# Patient Record
Sex: Female | Born: 1970 | Race: White | Hispanic: No | Marital: Married | Smoking: Former smoker
Health system: Southern US, Community
[De-identification: ages and names within clinical notes are randomized; demographics above are authoritative.]

## PROBLEM LIST (undated history)

## (undated) DIAGNOSIS — D219 Benign neoplasm of connective and other soft tissue, unspecified: Secondary | ICD-10-CM

## (undated) DIAGNOSIS — Z8742 Personal history of other diseases of the female genital tract: Secondary | ICD-10-CM

## (undated) HISTORY — PX: WISDOM TOOTH EXTRACTION: SHX21

## (undated) HISTORY — PX: TUBAL LIGATION: SHX77

## (undated) HISTORY — PX: DEEP SHOULDER TUMOR EXCISION: SUR577

## (undated) HISTORY — PX: APPENDECTOMY: SHX54

## (undated) HISTORY — DX: Benign neoplasm of connective and other soft tissue, unspecified: D21.9

## (undated) HISTORY — DX: Personal history of other diseases of the female genital tract: Z87.42

---

## 2009-05-12 ENCOUNTER — Ambulatory Visit (HOSPITAL_COMMUNITY): Admission: RE | Admit: 2009-05-12 | Discharge: 2009-05-12 | Payer: Self-pay | Admitting: Internal Medicine

## 2009-07-27 ENCOUNTER — Ambulatory Visit (HOSPITAL_COMMUNITY): Admission: RE | Admit: 2009-07-27 | Discharge: 2009-07-27 | Payer: Self-pay | Admitting: Internal Medicine

## 2013-10-28 ENCOUNTER — Ambulatory Visit: Payer: Self-pay

## 2015-12-04 ENCOUNTER — Encounter: Payer: Self-pay | Admitting: *Deleted

## 2015-12-07 ENCOUNTER — Ambulatory Visit (INDEPENDENT_AMBULATORY_CARE_PROVIDER_SITE_OTHER): Payer: Self-pay | Admitting: Obstetrics & Gynecology

## 2015-12-07 ENCOUNTER — Other Ambulatory Visit (HOSPITAL_COMMUNITY)
Admission: RE | Admit: 2015-12-07 | Discharge: 2015-12-07 | Disposition: A | Discharge status: Home / Self Care | Payer: Self-pay | Source: Ambulatory Visit | Attending: Obstetrics & Gynecology | Admitting: Obstetrics & Gynecology

## 2015-12-07 ENCOUNTER — Encounter: Payer: Self-pay | Admitting: Obstetrics & Gynecology

## 2015-12-07 VITALS — BP 110/70 | HR 72 | Ht 65.0 in | Wt 178.0 lb

## 2015-12-07 DIAGNOSIS — Z1151 Encounter for screening for human papillomavirus (HPV): Secondary | ICD-10-CM | POA: Insufficient documentation

## 2015-12-07 DIAGNOSIS — N813 Complete uterovaginal prolapse: Secondary | ICD-10-CM

## 2015-12-07 DIAGNOSIS — N8110 Cystocele, unspecified: Secondary | ICD-10-CM

## 2015-12-07 DIAGNOSIS — IMO0001 Reserved for inherently not codable concepts without codable children: Secondary | ICD-10-CM

## 2015-12-07 DIAGNOSIS — Z01419 Encounter for gynecological examination (general) (routine) without abnormal findings: Secondary | ICD-10-CM | POA: Insufficient documentation

## 2015-12-07 DIAGNOSIS — IMO0002 Reserved for concepts with insufficient information to code with codable children: Secondary | ICD-10-CM

## 2015-12-07 DIAGNOSIS — Z124 Encounter for screening for malignant neoplasm of cervix: Secondary | ICD-10-CM

## 2015-12-07 NOTE — Progress Notes (Signed)
Patient ID: Victoria Ramirez, female   DOB: 1971/04/08, 45 y.o.   MRN: [id number] Preoperative History and Physical  Victoria Ramirez is a 45 y.o. No obstetric history on file. with Patient's last menstrual period was 11/27/2015. admitted for a TVH anterior repair.  Grade 3 uterine prolapse Grade 2 cystocoele secondarily  PMH:    Past Medical History  Diagnosis Date  . History of PCOS   . Fibroid     PSH:     Past Surgical History  Procedure Laterality Date  . Tubal ligation    . Appendectomy    . Wisdom tooth extraction      POb/GynH:      OB History    No data available      SH:   Social History  Substance Use Topics  . Smoking status: Former Research scientist (life sciences)  . Smokeless tobacco: Never Used  . Alcohol Use: Yes     Comment: occ.    FH:    Family History  Problem Relation Age of Onset  . Asthma Mother   . Heart disease Father   . Polycystic ovary syndrome Sister      Allergies:  Allergies  Allergen Reactions  . Latex   . Penicillins Rash  . Sulfa Antibiotics Rash    Medications:      No current outpatient prescriptions on file.  Review of Systems:   Review of Systems  Constitutional: Negative for fever, chills, weight loss, malaise/fatigue and diaphoresis.  HENT: Negative for hearing loss, ear pain, nosebleeds, congestion, sore throat, neck pain, tinnitus and ear discharge.   Eyes: Negative for blurred vision, double vision, photophobia, pain, discharge and redness.  Respiratory: Negative for cough, hemoptysis, sputum production, shortness of breath, wheezing and stridor.   Cardiovascular: Negative for chest pain, palpitations, orthopnea, claudication, leg swelling and PND.  Gastrointestinal: Positive for abdominal pain. Negative for heartburn, nausea, vomiting, diarrhea, constipation, blood in stool and melena.  Genitourinary: Negative for dysuria, urgency, frequency, hematuria and flank pain.  Musculoskeletal: Negative for myalgias, back pain, joint pain and  falls.  Skin: Negative for itching and rash.  Neurological: Negative for dizziness, tingling, tremors, sensory change, speech change, focal weakness, seizures, loss of consciousness, weakness and headaches.  Endo/Heme/Allergies: Negative for environmental allergies and polydipsia. Does not bruise/bleed easily.  Psychiatric/Behavioral: Negative for depression, suicidal ideas, hallucinations, memory loss and substance abuse. The patient is not nervous/anxious and does not have insomnia.      PHYSICAL EXAM:  Blood pressure 110/70, pulse 72, height 5\' 5"  (1.651 m), weight 178 lb (80.74 kg), last menstrual period 11/27/2015.    Vitals reviewed. Constitutional: She is oriented to person, place, and time. She appears well-developed and well-nourished.  HENT:  Head: Normocephalic and atraumatic.  Right Ear: External ear normal.  Left Ear: External ear normal.  Nose: Nose normal.  Mouth/Throat: Oropharynx is clear and moist.  Eyes: Conjunctivae and EOM are normal. Pupils are equal, round, and reactive to light. Right eye exhibits no discharge. Left eye exhibits no discharge. No scleral icterus.  Neck: Normal range of motion. Neck supple. No tracheal deviation present. No thyromegaly present.  Cardiovascular: Normal rate, regular rhythm, normal heart sounds and intact distal pulses.  Exam reveals no gallop and no friction rub.   No murmur heard. Respiratory: Effort normal and breath sounds normal. No respiratory distress. She has no wheezes. She has no rales. She exhibits no tenderness.  GI: Soft. Bowel sounds are normal. She exhibits no distension and no mass. There  is tenderness. There is no rebound and no guarding.  Genitourinary:       Vulva is normal without lesions Vagina is pink moist without discharge cystocoele Cervix normal in appearance and pap is normal Uterus is normal size, contour, position, consistency, mobility, non-tender and prolapsed third degree Adnexa is negative with  normal sized ovaries by sonogram  Musculoskeletal: Normal range of motion. She exhibits no edema and no tenderness.  Neurological: She is alert and oriented to person, place, and time. She has normal reflexes. She displays normal reflexes. No cranial nerve deficit. She exhibits normal muscle tone. Coordination normal.  Skin: Skin is warm and dry. No rash noted. No erythema. No pallor.  Psychiatric: She has a normal mood and affect. Her behavior is normal. Judgment and thought content normal.    Labs: No results found for this or any previous visit (from the past 336 hour(s)).  EKG: No orders found for this or any previous visit.  Imaging Studies: No results found.    Assessment: 1. Third degree uterine prolapse   2. Cystocele, grade 2   Plan: Pt will require a TVH and anterior colporrhaphy, native tissue, vaginal vault suspension and pt elects to have a removal of both tubes and ovaries  Scheduled for 12/23/2015  Korianna Washer H 12/07/2015 2:32 PM

## 2015-12-09 LAB — CYTOLOGY - PAP

## 2015-12-16 NOTE — Patient Instructions (Signed)
Victoria Ramirez  12/16/2015     @PREFPERIOPPHARMACY @   Your procedure is scheduled on 12/23/2015.  Report to [facility] at 6:15 A.M.  Call this number if you have problems the morning of surgery:  [phone]   Remember:  Do not eat food or drink liquids after midnight.  Take these medicines the morning of surgery with A SIP OF WATER None   Do not wear jewelry, make-up or nail polish.  Do not wear lotions, powders, or perfumes.  You may wear deodorant.  Do not shave 48 hours prior to surgery.  Men may shave face and neck.  Do not bring valuables to the hospital.  [facility] is not responsible for any belongings or valuables.  Contacts, dentures or bridgework may not be worn into surgery.  Leave your suitcase in the car.  After surgery it may be brought to your room.  For patients admitted to the hospital, discharge time will be determined by your treatment team.  Patients discharged the day of surgery will not be allowed to drive home.   Please read over the following fact sheets that you were given. Surgical Site Infection Prevention and Anesthesia Post-op Instructions     PATIENT INSTRUCTIONS POST-ANESTHESIA  IMMEDIATELY FOLLOWING SURGERY:  Do not drive or operate machinery for the first twenty four hours after surgery.  Do not make any important decisions for twenty four hours after surgery or while taking narcotic pain medications or sedatives.  If you develop intractable nausea and vomiting or a severe headache please notify your doctor immediately.  FOLLOW-UP:  Please make an appointment with your surgeon as instructed. You do not need to follow up with anesthesia unless specifically instructed to do so.  WOUND CARE INSTRUCTIONS (if applicable):  Keep a dry clean dressing on the anesthesia/puncture wound site if there is drainage.  Once the wound has quit draining you may leave it open to air.  Generally you should leave the bandage intact for twenty four hours unless  there is drainage.  If the epidural site drains for more than 36-48 hours please call the anesthesia department.  QUESTIONS?:  Please feel free to call your physician or the hospital operator if you have any questions, and they will be happy to assist you.      Hysterectomy Information  A hysterectomy is a surgery in which your uterus is removed. This surgery may be done to treat various medical problems. After the surgery, you will no longer have menstrual periods. The surgery will also make you unable to become pregnant (sterile). The fallopian tubes and ovaries can be removed (bilateral salpingo-oophorectomy) during this surgery as well.  REASONS FOR A HYSTERECTOMY  Persistent, abnormal bleeding.  Lasting (chronic) pelvic pain or infection.  The lining of the uterus (endometrium) starts growing outside the uterus (endometriosis).  The endometrium starts growing in the muscle of the uterus (adenomyosis).  The uterus falls down into the vagina (pelvic organ prolapse).  Noncancerous growths in the uterus (uterine fibroids) that cause symptoms.  Precancerous cells.  Cervical cancer or uterine cancer. TYPES OF HYSTERECTOMIES  Supracervical hysterectomy--In this type, the top part of the uterus is removed, but not the cervix.  Total hysterectomy--The uterus and cervix are removed.  Radical hysterectomy--The uterus, the cervix, and the fibrous tissue that holds the uterus in place in the pelvis (parametrium) are removed. WAYS A HYSTERECTOMY CAN BE PERFORMED  Abdominal hysterectomy--A large surgical cut (incision) is made in the abdomen. The uterus is  removed through this incision.  Vaginal hysterectomy--An incision is made in the vagina. The uterus is removed through this incision. There are no abdominal incisions.  Conventional laparoscopic hysterectomy--Three or four small incisions are made in the abdomen. A thin, lighted tube with a camera (laparoscope) is inserted into one of  the incisions. Other tools are put through the other incisions. The uterus is cut into small pieces. The small pieces are removed through the incisions, or they are removed through the vagina.  Laparoscopically assisted vaginal hysterectomy (LAVH)--Three or four small incisions are made in the abdomen. Part of the surgery is performed laparoscopically and part vaginally. The uterus is removed through the vagina.  Robot-assisted laparoscopic hysterectomy--A laparoscope and other tools are inserted into 3 or 4 small incisions in the abdomen. A computer-controlled device is used to give the surgeon a 3D image and to help control the surgical instruments. This allows for more precise movements of surgical instruments. The uterus is cut into small pieces and removed through the incisions or removed through the vagina. RISKS AND COMPLICATIONS  Possible complications associated with this procedure include:  Bleeding and risk of blood transfusion. Tell your health care provider if you do not want to receive any blood products.  Blood clots in the legs or lung.  Infection.  Injury to surrounding organs.  Problems or side effects related to anesthesia.  Conversion to an abdominal hysterectomy from one of the other techniques. WHAT TO EXPECT AFTER A HYSTERECTOMY  You will be given pain medicine.  You will need to have someone with you for the first 3-5 days after you go home.  You will need to follow up with your surgeon in 2-4 weeks after surgery to evaluate your progress.  You may have early menopause symptoms such as hot flashes, night sweats, and insomnia.  If you had a hysterectomy for a problem that was not cancer or not a condition that could lead to cancer, then you no longer need Pap tests. However, even if you no longer need a Pap test, a regular exam is a good idea to make sure no other problems are starting.   This information is not intended to replace advice given to you by your  health care provider. Make sure you discuss any questions you have with your health care provider.   Document Released: 12/28/2000 Document Revised: 04/24/2013 Document Reviewed: 03/11/2013 Elsevier Interactive Patient Education [employer].

## 2015-12-18 ENCOUNTER — Other Ambulatory Visit: Payer: Self-pay | Admitting: Obstetrics & Gynecology

## 2015-12-18 ENCOUNTER — Encounter (HOSPITAL_COMMUNITY): Payer: Self-pay

## 2015-12-18 ENCOUNTER — Encounter (HOSPITAL_COMMUNITY)
Admission: RE | Admit: 2015-12-18 | Discharge: 2015-12-18 | Disposition: A | Discharge status: Home / Self Care | Payer: Self-pay | Source: Ambulatory Visit | Attending: Obstetrics & Gynecology | Admitting: Obstetrics & Gynecology

## 2015-12-18 DIAGNOSIS — Z01812 Encounter for preprocedural laboratory examination: Secondary | ICD-10-CM | POA: Insufficient documentation

## 2015-12-18 DIAGNOSIS — N813 Complete uterovaginal prolapse: Secondary | ICD-10-CM | POA: Insufficient documentation

## 2015-12-18 DIAGNOSIS — N8110 Cystocele, unspecified: Secondary | ICD-10-CM | POA: Insufficient documentation

## 2015-12-18 LAB — CBC
HCT: 36.4 % (ref 36.0–46.0)
Hemoglobin: 12.6 g/dL (ref 12.0–15.0)
MCH: 31.1 pg (ref 26.0–34.0)
MCHC: 34.6 g/dL (ref 30.0–36.0)
MCV: 89.9 fL (ref 78.0–100.0)
PLATELETS: 262 10*3/uL (ref 150–400)
RBC: 4.05 MIL/uL (ref 3.87–5.11)
RDW: 12.4 % (ref 11.5–15.5)
WBC: 6.2 10*3/uL (ref 4.0–10.5)

## 2015-12-18 LAB — URINALYSIS, ROUTINE W REFLEX MICROSCOPIC
Bilirubin Urine: NEGATIVE
Glucose, UA: NEGATIVE mg/dL
Ketones, ur: NEGATIVE mg/dL
LEUKOCYTES UA: NEGATIVE
NITRITE: NEGATIVE
PROTEIN: NEGATIVE mg/dL
SPECIFIC GRAVITY, URINE: 1.01 (ref 1.005–1.030)
pH: 6.5 (ref 5.0–8.0)

## 2015-12-18 LAB — COMPREHENSIVE METABOLIC PANEL
ALT: 17 U/L (ref 14–54)
AST: 17 U/L (ref 15–41)
Albumin: 4.3 g/dL (ref 3.5–5.0)
Alkaline Phosphatase: 82 U/L (ref 38–126)
Anion gap: 7 (ref 5–15)
BUN: 12 mg/dL (ref 6–20)
CHLORIDE: 108 mmol/L (ref 101–111)
CO2: 25 mmol/L (ref 22–32)
CREATININE: 0.61 mg/dL (ref 0.44–1.00)
Calcium: 9.2 mg/dL (ref 8.9–10.3)
GFR calc non Af Amer: >60 mL/min (ref >60)
Glucose, Bld: 116 mg/dL — ABNORMAL HIGH (ref 65–99)
POTASSIUM: 3.8 mmol/L (ref 3.5–5.1)
SODIUM: 140 mmol/L (ref 135–145)
Total Bilirubin: 0.7 mg/dL (ref 0.3–1.2)
Total Protein: 6.9 g/dL (ref 6.5–8.1)

## 2015-12-18 LAB — URINE MICROSCOPIC-ADD ON: WBC UA: NONE SEEN WBC/hpf (ref 0–5)

## 2015-12-18 LAB — HCG, QUANTITATIVE, PREGNANCY

## 2015-12-19 LAB — TYPE AND SCREEN
ABO/RH(D): B POS
ANTIBODY SCREEN: NEGATIVE

## 2015-12-23 ENCOUNTER — Ambulatory Visit (HOSPITAL_COMMUNITY): Payer: Self-pay | Admitting: Anesthesiology

## 2015-12-23 ENCOUNTER — Encounter (HOSPITAL_COMMUNITY): Payer: Self-pay | Admitting: *Deleted

## 2015-12-23 ENCOUNTER — Encounter (HOSPITAL_COMMUNITY)
Admission: RE | Disposition: A | Discharge status: Home / Self Care | Payer: Self-pay | Source: Ambulatory Visit | Attending: Obstetrics & Gynecology

## 2015-12-23 ENCOUNTER — Observation Stay (HOSPITAL_COMMUNITY)
Admission: RE | Admit: 2015-12-23 | Discharge: 2015-12-24 | Disposition: A | Discharge status: Home / Self Care | Payer: Self-pay | Source: Ambulatory Visit | Attending: Obstetrics & Gynecology | Admitting: Obstetrics & Gynecology

## 2015-12-23 DIAGNOSIS — N888 Other specified noninflammatory disorders of cervix uteri: Secondary | ICD-10-CM

## 2015-12-23 DIAGNOSIS — N8111 Cystocele, midline: Secondary | ICD-10-CM

## 2015-12-23 DIAGNOSIS — N813 Complete uterovaginal prolapse: Principal | ICD-10-CM | POA: Insufficient documentation

## 2015-12-23 DIAGNOSIS — Z90710 Acquired absence of both cervix and uterus: Secondary | ICD-10-CM | POA: Diagnosis present

## 2015-12-23 DIAGNOSIS — N83209 Unspecified ovarian cyst, unspecified side: Secondary | ICD-10-CM

## 2015-12-23 DIAGNOSIS — Z87891 Personal history of nicotine dependence: Secondary | ICD-10-CM | POA: Insufficient documentation

## 2015-12-23 HISTORY — PX: VAGINAL HYSTERECTOMY: SHX2639

## 2015-12-23 SURGERY — HYSTERECTOMY, VAGINAL
Anesthesia: General

## 2015-12-23 MED ORDER — ROCURONIUM BROMIDE 100 MG/10ML IV SOLN
INTRAVENOUS | Status: DC | PRN
  Administered 2015-12-23: 40 mg via INTRAVENOUS
  Administered 2015-12-23: 20 mg via INTRAVENOUS

## 2015-12-23 MED ORDER — GLYCOPYRROLATE 0.2 MG/ML IJ SOLN
INTRAMUSCULAR | Status: DC | PRN
  Administered 2015-12-23: .7 mg via INTRAVENOUS

## 2015-12-23 MED ORDER — BISACODYL 10 MG RE SUPP
10.0000 mg | Freq: Every day | RECTAL | Status: DC | PRN
Start: 2015-12-23 — End: 2015-12-24

## 2015-12-23 MED ORDER — FENTANYL CITRATE (PF) 250 MCG/5ML IJ SOLN
INTRAMUSCULAR | Status: DC | PRN
  Administered 2015-12-23 (×10): 50 ug via INTRAVENOUS

## 2015-12-23 MED ORDER — FENTANYL CITRATE (PF) 250 MCG/5ML IJ SOLN
INTRAMUSCULAR | Status: AC
  Filled 2015-12-23: qty 5

## 2015-12-23 MED ORDER — MIDAZOLAM HCL 2 MG/2ML IJ SOLN
INTRAMUSCULAR | Status: AC
  Filled 2015-12-23: qty 2

## 2015-12-23 MED ORDER — LACTATED RINGERS IV SOLN
INTRAVENOUS | Status: DC
  Administered 2015-12-23: 1000 mL via INTRAVENOUS
  Administered 2015-12-23: 09:00:00 via INTRAVENOUS

## 2015-12-23 MED ORDER — KETOROLAC TROMETHAMINE 30 MG/ML IJ SOLN
30.0000 mg | Freq: Once | INTRAMUSCULAR | Status: AC
  Administered 2015-12-23: 30 mg via INTRAVENOUS
  Filled 2015-12-23: qty 1

## 2015-12-23 MED ORDER — SODIUM CHLORIDE 0.9 % IV SOLN: 8.0000 mg | Freq: Four times a day (QID) | INTRAVENOUS | Status: DC | PRN

## 2015-12-23 MED ORDER — ONDANSETRON HCL 4 MG PO TABS: 8.0000 mg | ORAL_TABLET | Freq: Four times a day (QID) | ORAL | Status: DC | PRN

## 2015-12-23 MED ORDER — NEOSTIGMINE METHYLSULFATE 10 MG/10ML IV SOLN
INTRAVENOUS | Status: DC | PRN
  Administered 2015-12-23: 4 mg via INTRAVENOUS

## 2015-12-23 MED ORDER — CIPROFLOXACIN IN D5W 400 MG/200ML IV SOLN
INTRAVENOUS | Status: AC
  Filled 2015-12-23: qty 200

## 2015-12-23 MED ORDER — ROCURONIUM BROMIDE 50 MG/5ML IV SOLN
INTRAVENOUS | Status: AC
  Filled 2015-12-23: qty 1

## 2015-12-23 MED ORDER — KCL IN DEXTROSE-NACL 20-5-0.45 MEQ/L-%-% IV SOLN
INTRAVENOUS | Status: DC
  Administered 2015-12-23 – 2015-12-24 (×3): via INTRAVENOUS

## 2015-12-23 MED ORDER — LIDOCAINE HCL (PF) 1 % IJ SOLN
INTRAMUSCULAR | Status: AC
  Filled 2015-12-23: qty 5

## 2015-12-23 MED ORDER — CLINDAMYCIN PHOSPHATE 900 MG/50ML IV SOLN
INTRAVENOUS | Status: AC
  Filled 2015-12-23: qty 50

## 2015-12-23 MED ORDER — LIDOCAINE HCL 1 % IJ SOLN
INTRAMUSCULAR | Status: DC | PRN
  Administered 2015-12-23: 40 mg via INTRADERMAL

## 2015-12-23 MED ORDER — BUPIVACAINE-EPINEPHRINE (PF) 0.5% -1:200000 IJ SOLN
INTRAMUSCULAR | Status: DC | PRN
  Administered 2015-12-23: 18 mL

## 2015-12-23 MED ORDER — BUPIVACAINE-EPINEPHRINE (PF) 0.5% -1:200000 IJ SOLN
INTRAMUSCULAR | Status: AC
  Filled 2015-12-23: qty 30

## 2015-12-23 MED ORDER — OXYCODONE-ACETAMINOPHEN 5-325 MG PO TABS: 1.0000 | ORAL_TABLET | ORAL | Status: DC | PRN

## 2015-12-23 MED ORDER — CIPROFLOXACIN HCL 250 MG PO TABS
500.0000 mg | ORAL_TABLET | Freq: Two times a day (BID) | ORAL | Status: DC
  Administered 2015-12-23 – 2015-12-24 (×2): 500 mg via ORAL
  Filled 2015-12-23 (×2): qty 2

## 2015-12-23 MED ORDER — CLINDAMYCIN PHOSPHATE 900 MG/50ML IV SOLN
900.0000 mg | INTRAVENOUS | Status: AC
  Administered 2015-12-23: 900 mg via INTRAVENOUS

## 2015-12-23 MED ORDER — CIPROFLOXACIN IN D5W 400 MG/200ML IV SOLN
400.0000 mg | INTRAVENOUS | Status: AC
  Administered 2015-12-23: 400 mg via INTRAVENOUS

## 2015-12-23 MED ORDER — ALUM & MAG HYDROXIDE-SIMETH 200-200-20 MG/5ML PO SUSP: 30.0000 mL | ORAL | Status: DC | PRN

## 2015-12-23 MED ORDER — SODIUM CHLORIDE 0.9 % IR SOLN
Status: DC | PRN
  Administered 2015-12-23: 3000 mL

## 2015-12-23 MED ORDER — MIDAZOLAM HCL 2 MG/2ML IJ SOLN
1.0000 mg | INTRAMUSCULAR | Status: DC | PRN
  Administered 2015-12-23 (×2): 2 mg via INTRAVENOUS
  Filled 2015-12-23: qty 2

## 2015-12-23 MED ORDER — HYDROMORPHONE HCL 2 MG PO TABS
2.0000 mg | ORAL_TABLET | ORAL | Status: DC | PRN
  Administered 2015-12-23: 4 mg via ORAL
  Administered 2015-12-24: 2 mg via ORAL
  Administered 2015-12-24: 4 mg via ORAL
  Filled 2015-12-23: qty 2
  Filled 2015-12-23 (×3): qty 1
  Filled 2015-12-23: qty 2

## 2015-12-23 MED ORDER — HYDROMORPHONE HCL 1 MG/ML IJ SOLN
1.0000 mg | INTRAMUSCULAR | Status: DC | PRN
  Administered 2015-12-23 (×3): 1 mg via INTRAVENOUS
  Filled 2015-12-23 (×3): qty 1

## 2015-12-23 MED ORDER — 0.9 % SODIUM CHLORIDE (POUR BTL) OPTIME
TOPICAL | Status: DC | PRN
  Administered 2015-12-23: 1000 mL

## 2015-12-23 MED ORDER — ONDANSETRON HCL 4 MG/2ML IJ SOLN
4.0000 mg | Freq: Once | INTRAMUSCULAR | Status: AC | PRN
  Administered 2015-12-23: 4 mg via INTRAVENOUS
  Filled 2015-12-23: qty 2

## 2015-12-23 MED ORDER — PROPOFOL 10 MG/ML IV BOLUS
INTRAVENOUS | Status: AC
  Filled 2015-12-23: qty 20

## 2015-12-23 MED ORDER — FENTANYL CITRATE (PF) 100 MCG/2ML IJ SOLN
25.0000 ug | INTRAMUSCULAR | Status: DC | PRN
  Administered 2015-12-23 (×4): 50 ug via INTRAVENOUS
  Filled 2015-12-23 (×2): qty 2

## 2015-12-23 MED ORDER — PROPOFOL 10 MG/ML IV BOLUS
INTRAVENOUS | Status: DC | PRN
  Administered 2015-12-23: 150 mg via INTRAVENOUS

## 2015-12-23 MED ORDER — ZOLPIDEM TARTRATE 5 MG PO TABS: 5.0000 mg | ORAL_TABLET | Freq: Every evening | ORAL | Status: DC | PRN

## 2015-12-23 MED ORDER — ONDANSETRON HCL 4 MG/2ML IJ SOLN
4.0000 mg | Freq: Once | INTRAMUSCULAR | Status: AC
  Administered 2015-12-23: 4 mg via INTRAVENOUS

## 2015-12-23 MED ORDER — ONDANSETRON HCL 4 MG/2ML IJ SOLN
INTRAMUSCULAR | Status: AC
  Filled 2015-12-23: qty 2

## 2015-12-23 SURGICAL SUPPLY — 49 items
APPLIER CLIP 13 LRG OPEN (CLIP) ×3
BAG HAMPER (MISCELLANEOUS) ×3 IMPLANT
CATH FOLEY LATEX FREE 14FR (CATHETERS) ×2
CATH FOLEY LF 14FR (CATHETERS) ×1 IMPLANT
CLIP APPLIE 13 LRG OPEN (CLIP) ×1 IMPLANT
CLOTH BEACON ORANGE TIMEOUT ST (SAFETY) ×3 IMPLANT
COVER LIGHT HANDLE STERIS (MISCELLANEOUS) ×6 IMPLANT
DECANTER SPIKE VIAL GLASS SM (MISCELLANEOUS) ×3 IMPLANT
DEVICE CAPIO SUTURING (INSTRUMENTS)
DEVICE CAPIO SUTURING OPC (INSTRUMENTS) IMPLANT
DRAPE PROXIMA HALF (DRAPES) ×3 IMPLANT
DRAPE STERI URO 9X17 APER PCH (DRAPES) ×3 IMPLANT
ELECT REM PT RETURN 9FT ADLT (ELECTROSURGICAL) ×3
ELECTRODE REM PT RTRN 9FT ADLT (ELECTROSURGICAL) ×1 IMPLANT
FORMALIN 10 PREFIL 480ML (MISCELLANEOUS) ×3 IMPLANT
GAUZE PACKING 2X5 YD STRL (GAUZE/BANDAGES/DRESSINGS) IMPLANT
GLOVE BIOGEL PI IND STRL 7.0 (GLOVE) ×3 IMPLANT
GLOVE BIOGEL PI IND STRL 7.5 (GLOVE) ×1 IMPLANT
GLOVE BIOGEL PI IND STRL 8 (GLOVE) ×1 IMPLANT
GLOVE BIOGEL PI INDICATOR 7.0 (GLOVE) ×6
GLOVE BIOGEL PI INDICATOR 7.5 (GLOVE) ×2
GLOVE BIOGEL PI INDICATOR 8 (GLOVE) ×2
GLOVE ECLIPSE 8.0 STRL XLNG CF (GLOVE) IMPLANT
GOWN STRL REUS W/TWL LRG LVL3 (GOWN DISPOSABLE) ×6 IMPLANT
GOWN STRL REUS W/TWL XL LVL3 (GOWN DISPOSABLE) ×3 IMPLANT
IV NS IRRIG 3000ML ARTHROMATIC (IV SOLUTION) ×3 IMPLANT
KIT BLADEGUARD II DBL (SET/KITS/TRAYS/PACK) ×3 IMPLANT
KIT ROOM TURNOVER AP CYSTO (KITS) ×3 IMPLANT
KIT ROOM TURNOVER APOR (KITS) ×3 IMPLANT
MANIFOLD NEPTUNE II (INSTRUMENTS) ×3 IMPLANT
NEEDLE HYPO 22GX1.5 SAFETY (NEEDLE) ×3 IMPLANT
NS IRRIG 1000ML POUR BTL (IV SOLUTION) ×3 IMPLANT
PACK PERI GYN (CUSTOM PROCEDURE TRAY) ×3 IMPLANT
PAD ARMBOARD 7.5X6 YLW CONV (MISCELLANEOUS) ×3 IMPLANT
SET BASIN LINEN APH (SET/KITS/TRAYS/PACK) ×3 IMPLANT
SET IV ADMIN VERSALIGHT (MISCELLANEOUS) ×3 IMPLANT
SUT CAPIO POLYGLYCOLIC (SUTURE) IMPLANT
SUT MNCRL+ AB 3-0 CT1 36 (SUTURE) ×1 IMPLANT
SUT MON AB 3-0 SH 27 (SUTURE) IMPLANT
SUT MONOCRYL AB 3-0 CT1 36IN (SUTURE) ×2
SUT VIC AB 0 CT1 27 (SUTURE) ×8
SUT VIC AB 0 CT1 27XBRD ANTBC (SUTURE) IMPLANT
SUT VIC AB 0 CT1 27XCR 8 STRN (SUTURE) ×4 IMPLANT
SUT VIC AB 0 CT2 8-18 (SUTURE) ×3 IMPLANT
SYR CONTROL 10ML LL (SYRINGE) ×3 IMPLANT
SYRINGE 10CC LL (SYRINGE) ×3 IMPLANT
TOWEL OR 17X26 4PK STRL BLUE (TOWEL DISPOSABLE) ×3 IMPLANT
TRAY FOLEY CATH SILVER 16FR (SET/KITS/TRAYS/PACK) IMPLANT
VERSALIGHT (MISCELLANEOUS) ×3 IMPLANT

## 2015-12-23 NOTE — Anesthesia Postprocedure Evaluation (Signed)
Anesthesia Post Note  Patient: WAJEEHA ALSBURY  Procedure(s) Performed: Procedure(s) (LRB): HYSTERECTOMY VAGINAL, REMOVAL OF BOTH TUBES AND OVARIES, VAGINAL APEX SUSPENSION (N/A)  Patient location during evaluation: PACU Anesthesia Type: General Level of consciousness: awake and alert Pain management: pain level controlled Vital Signs Assessment: post-procedure vital signs reviewed and stable Respiratory status: spontaneous breathing Cardiovascular status: blood pressure returned to baseline Postop Assessment: no signs of nausea or vomiting Anesthetic complications: no    Last Vitals:  Filed Vitals:   12/23/15 0730 12/23/15 0735  BP: 101/67 100/62  Pulse:    Temp:    Resp: 20 25    Last Pain:  Filed Vitals:   12/23/15 0736  PainSc: 3                  Marli Diego

## 2015-12-23 NOTE — Anesthesia Preprocedure Evaluation (Signed)
Anesthesia Evaluation  Patient identified by MRN, date of birth, ID band Patient awake    Reviewed: Allergy & Precautions, NPO status , Patient's Chart, lab work & pertinent test results  Airway Mallampati: I  TM Distance: >3 FB     Dental  (+) Teeth Intact, Dental Advisory Given   Pulmonary former smoker,    breath sounds clear to auscultation       Cardiovascular negative cardio ROS   Rhythm:Regular Rate:Normal     Neuro/Psych    GI/Hepatic negative GI ROS,   Endo/Other    Renal/GU      Musculoskeletal   Abdominal   Peds  Hematology   Anesthesia Other Findings   Reproductive/Obstetrics                             Anesthesia Physical Anesthesia Plan  ASA: I  Anesthesia Plan: General   Post-op Pain Management:    Induction: Intravenous  Airway Management Planned: Oral ETT  Additional Equipment:   Intra-op Plan:   Post-operative Plan: Extubation in OR  Informed Consent: I have reviewed the patients History and Physical, chart, labs and discussed the procedure including the risks, benefits and alternatives for the proposed anesthesia with the patient or authorized representative who has indicated his/her understanding and acceptance.     Plan Discussed with:   Anesthesia Plan Comments:         Anesthesia Quick Evaluation

## 2015-12-23 NOTE — Interval H&P Note (Signed)
History and Physical Interval Note:  12/23/2015 7:36 AM  Victoria Ramirez  has presented today for surgery, with the diagnosis of 3rd degree uterine prolapse cystocele  The various methods of treatment have been discussed with the patient and family. After consideration of risks, benefits and other options for treatment, the patient has consented to  Procedure(s): HYSTERECTOMY VAGINAL, REMOVAL OF BOTH TUBES AND OVARIES (N/A) ANTERIOR REPAIR (CYSTOCELE) (N/A) as a surgical intervention .  The patient's history has been reviewed, patient examined, no change in status, stable for surgery.  I have reviewed the patient's chart and labs.  Questions were answered to the patient's satisfaction.     EURE,LUTHER H

## 2015-12-23 NOTE — Op Note (Signed)
Preoperative diagnosis:  1.  Grade III uterine prolapse                                         2.  Grade II bladder prolapse                                         3.                                           4.    Postoperative diagnosis:  Same as above + after TVH and vaginal apex fixation there was minimal bladder relaxation,   Procedure:  Vaginal hysterectomy with removal of both tubes and ovaries                      In situ vaginal apex suspension  Surgeon:  Florian Buff MD  Anesthesia:  General Endotracheal  Findings:  Grade III uterine prolapse, uterus normal, both tubes and ovaries were normal After hysterectomy and apex suspension of the vagina the bladder support was adequate and anterior colporrhaphy was not appropriate at this time  Description of operation:  The patient was taken from the preoperative area to the operating room in stable condition. She was placed in the supine position and underwent a  General endotracheal anesthetic. She was then placed in the dorsal lithotomy position. Patient was prepped and draped in the usual sterile fashion and a Foley catheter was placed.  A weighted speculum was placed and the cervix was grasped with thyroid tenaculums both anteriorly and posteriorly.  0.5% Marcaine plain was injected in a circumferential fashion about the cervix and the electrocautery unit was used to incise the vagina and push at all cervix.  The posterior cul-de-sac was then entered sharply without difficulty.  The uterosacral ligaments were clamped cut and inspection suture ligated and held.  The cardinal ligaments were then clamped cut transfixion suture ligated and cut. The anterior peritoneum was identified the anterior cul-de-sac was entered sharply without difficulty. The anterior and posterior leaves of the broad ligament were plicated and the uterine vessels were clamped cut and suture ligated. Serial pedicles were taken of the fundus with each pedicle being  clamped cut and suture ligated. The utero-ovarian ligaments were crossclamped the uterus was removed and both pedicles were transfixion suture ligated. The infundibulo pelvic ligaments were then cross clamped, and double suture ligated, fore and aaft suture techniques. There was good hemostasis of all the pedicles. The peritoneum was then closed in a pursestring fashion using 3-0 monocryl.  The uterosacral ligaments were plicated with several sutures to provide in situ vaginal apex support.The anterior posterior vagina was closed in interrupted fashion with good resultant hemostasis. Our closure the lower pelvis and vagina were irrigated vigorously.Once this was all done there was really no significant cystocoele, certainly not requiring an anterior repair.  My best clinical guess is that at some point in the future 0-20 years she may require additional support surgery but would not want to intervene at this point.   The sponge needle and instrument counts were correct x 3.  Total blood loss for the procedure was 100 cc.  The patient received Cipro and cleocin and 30 mg of Toradol IV preoperatively prophylactically.  She was taken to the recovery room in good stable condition awake alert doing well.  All specimens were sent to pathology  Demorris Choyce H 12/23/2015, 9:55 AM

## 2015-12-23 NOTE — Anesthesia Procedure Notes (Addendum)
Procedure Name: Intubation Date/Time: 12/23/2015 7:50 AM Performed by: Tressie Stalker E Pre-anesthesia Checklist: Patient identified, Patient being monitored, Timeout performed, Emergency Drugs available and Suction available Patient Re-evaluated:Patient Re-evaluated prior to inductionOxygen Delivery Method: Circle system utilized Preoxygenation: Pre-oxygenation with 100% oxygen Intubation Type: IV induction Ventilation: Mask ventilation without difficulty Laryngoscope Size: Mac and 3 Grade View: Grade I Tube type: Oral Tube size: 7.0 mm Number of attempts: 1 Airway Equipment and Method: Stylet Placement Confirmation: ETT inserted through vocal cords under direct vision,  positive ETCO2 and breath sounds checked- equal and bilateral Secured at: 21 cm Tube secured with: Tape Dental Injury: Teeth and Oropharynx as per pre-operative assessment

## 2015-12-23 NOTE — Transfer of Care (Signed)
Immediate Anesthesia Transfer of Care Note  Patient: Victoria Ramirez  Procedure(s) Performed: Procedure(s): HYSTERECTOMY VAGINAL, REMOVAL OF BOTH TUBES AND OVARIES, VAGINAL APEX SUSPENSION (N/A)  Patient Location: PACU  Anesthesia Type:General  Level of Consciousness: awake, alert  and oriented  Airway & Oxygen Therapy: Patient Spontanous Breathing and Patient connected to nasal cannula oxygen  Post-op Assessment: Report given to RN  Post vital signs: Reviewed and stable  Last Vitals:  Filed Vitals:   12/23/15 0730 12/23/15 0735  BP: 101/67 100/62  Pulse:    Temp:    Resp: 20 25    Last Pain:  Filed Vitals:   12/23/15 0736  PainSc: 3       Patients Stated Pain Goal: 9 ([id number])  Complications: No apparent anesthesia complications

## 2015-12-23 NOTE — H&P (Signed)
Preoperative History and Physical  Victoria Ramirez is a 45 y.o. No obstetric history on file. with Patient's last menstrual period was 11/27/2015. admitted for a TVHBSO anterior colporrhaphy for symptomatic Grade 3 uterine and grade 2 bladder prolapse.  Pt elects removal of both tubes and ovaries  PMH:  Past Medical History  Diagnosis Date  . History of PCOS   . Fibroid     PSH:  Past Surgical History  Procedure Laterality Date  . Tubal ligation    . Appendectomy    . Wisdom tooth extraction      POb/GynH:  OB History    No data available      SH:  Social History  Substance Use Topics  . Smoking status: Former Research scientist (life sciences)  . Smokeless tobacco: Never Used  . Alcohol Use: Yes     Comment: occ.    FH:  Family History  Problem Relation Age of Onset  . Asthma Mother   . Heart disease Father   . Polycystic ovary syndrome Sister      Allergies:  Allergies  Allergen Reactions  . Latex   . Penicillins Rash  . Sulfa Antibiotics Rash    Medications: No current outpatient prescriptions on file.  Review of Systems:   Review of Systems  Constitutional: Negative for fever, chills, weight loss, malaise/fatigue and diaphoresis.  HENT: Negative for hearing loss, ear pain, nosebleeds, congestion, sore throat, neck pain, tinnitus and ear discharge.  Eyes: Negative for blurred vision, double vision, photophobia, pain, discharge and redness.  Respiratory: Negative for cough, hemoptysis, sputum production, shortness of breath, wheezing and stridor.  Cardiovascular: Negative for chest pain, palpitations, orthopnea, claudication, leg swelling and PND.  Gastrointestinal: Negative for abdominal pain. Negative for heartburn, nausea, vomiting, diarrhea, constipation, blood in stool and melena.  Genitourinary: Negative for dysuria, urgency, frequency, hematuria and flank pain.   Musculoskeletal: Negative for myalgias, back pain, joint pain and falls.  Skin: Negative for itching and rash.  Neurological: Negative for dizziness, tingling, tremors, sensory change, speech change, focal weakness, seizures, loss of consciousness, weakness and headaches.  Endo/Heme/Allergies: Negative for environmental allergies and polydipsia. Does not bruise/bleed easily.  Psychiatric/Behavioral: Negative for depression, suicidal ideas, hallucinations, memory loss and substance abuse. The patient is not nervous/anxious and does not have insomnia.     PHYSICAL EXAM:  Blood pressure 110/70, pulse 72, height '5\' 5"'  (1.651 m), weight 178 lb (80.74 kg), last menstrual period 11/27/2015.   Vitals reviewed. Constitutional: She is oriented to person, place, and time. She appears well-developed and well-nourished.  HENT:  Head: Normocephalic and atraumatic.  Right Ear: External ear normal.  Left Ear: External ear normal.  Nose: Nose normal.  Mouth/Throat: Oropharynx is clear and moist.  Eyes: Conjunctivae and EOM are normal. Pupils are equal, round, and reactive to light. Right eye exhibits no discharge. Left eye exhibits no discharge. No scleral icterus.  Neck: Normal range of motion. Neck supple. No tracheal deviation present. No thyromegaly present.  Cardiovascular: Normal rate, regular rhythm, normal heart sounds and intact distal pulses. Exam reveals no gallop and no friction rub.  No murmur heard. Respiratory: Effort normal and breath sounds normal. No respiratory distress. She has no wheezes. She has no rales. She exhibits no tenderness.  GI: Soft. Bowel sounds are normal. She exhibits no distension and no mass. There is tenderness. There is no rebound and no guarding.  Genitourinary:   Vulva is normal without lesions Vagina is pink moist without discharge Grade 2 bladder prolapse Cervix  normal in appearance and pap is normal Uterus is prolapsed third degree NSSC Adnexa  is negative with normal sized ovaries by sonogram  Musculoskeletal: Normal range of motion. She exhibits no edema and no tenderness.  Neurological: She is alert and oriented to person, place, and time. She has normal reflexes. She displays normal reflexes. No cranial nerve deficit. She exhibits normal muscle tone. Coordination normal.  Skin: Skin is warm and dry. No rash noted. No erythema. No pallor.  Psychiatric: She has a normal mood and affect. Her behavior is normal. Judgment and thought content normal.    Labs: Recent Results (from the past 2160 hour(s))  Cytology - PAP     Status: None   Collection Time: 12/07/15 12:00 AM  Result Value Ref Range   CYTOLOGY - PAP PAP RESULT   Type and screen     Status: None   Collection Time: 12/18/15  8:51 AM  Result Value Ref Range   ABO/RH(D) B POS    Antibody Screen NEG    Sample Expiration 01/01/2016    Extend sample reason NO TRANSFUSIONS OR PREGNANCY IN THE PAST 3 MONTHS   CBC     Status: None   Collection Time: 12/18/15  8:51 AM  Result Value Ref Range   WBC 6.2 4.0 - 10.5 K/uL   RBC 4.05 3.87 - 5.11 MIL/uL   Hemoglobin 12.6 12.0 - 15.0 g/dL   HCT 36.4 36.0 - 46.0 %   MCV 89.9 78.0 - 100.0 fL   MCH 31.1 26.0 - 34.0 pg   MCHC 34.6 30.0 - 36.0 g/dL   RDW 12.4 11.5 - 15.5 %   Platelets 262 150 - 400 K/uL  Comprehensive metabolic panel     Status: Abnormal   Collection Time: 12/18/15  8:51 AM  Result Value Ref Range   Sodium 140 135 - 145 mmol/L   Potassium 3.8 3.5 - 5.1 mmol/L   Chloride 108 101 - 111 mmol/L   CO2 25 22 - 32 mmol/L   Glucose, Bld 116 (H) 65 - 99 mg/dL   BUN 12 6 - 20 mg/dL   Creatinine, Ser 0.61 0.44 - 1.00 mg/dL   Calcium 9.2 8.9 - 10.3 mg/dL   Total Protein 6.9 6.5 - 8.1 g/dL   Albumin 4.3 3.5 - 5.0 g/dL   AST 17 15 - 41 U/L   ALT 17 14 - 54 U/L   Alkaline Phosphatase 82 38 - 126 U/L   Total Bilirubin 0.7 0.3 - 1.2 mg/dL   GFR calc non Af Amer >60 >60 mL/min   GFR calc Af Amer >60 >60 mL/min     Comment: (NOTE) The eGFR has been calculated using the CKD EPI equation. This calculation has not been validated in all clinical situations. eGFR's persistently <60 mL/min signify possible Chronic Kidney Disease.    Anion gap 7 5 - 15  Urinalysis, Routine w reflex microscopic (not at [facility])     Status: Abnormal   Collection Time: 12/18/15  8:51 AM  Result Value Ref Range   Color, Urine YELLOW YELLOW   APPearance CLEAR CLEAR   Specific Gravity, Urine 1.010 1.005 - 1.030   pH 6.5 5.0 - 8.0   Glucose, UA NEGATIVE NEGATIVE mg/dL   Hgb urine dipstick SMALL (A) NEGATIVE   Bilirubin Urine NEGATIVE NEGATIVE   Ketones, ur NEGATIVE NEGATIVE mg/dL   Protein, ur NEGATIVE NEGATIVE mg/dL   Nitrite NEGATIVE NEGATIVE   Leukocytes, UA NEGATIVE NEGATIVE  Urine microscopic-add on  Status: Abnormal   Collection Time: 12/18/15  8:51 AM  Result Value Ref Range   Squamous Epithelial / LPF 0-5 (A) NONE SEEN   WBC, UA NONE SEEN 0 - 5 WBC/hpf   RBC / HPF 0-5 0 - 5 RBC/hpf   Bacteria, UA RARE (A) NONE SEEN  hCG, quantitative, pregnancy     Status: None   Collection Time: 12/18/15  8:52 AM  Result Value Ref Range   hCG, Beta Chain, Quant, S <1 <5 mIU/mL    Comment:          GEST. AGE      CONC.  (mIU/mL)   <=1 WEEK        5 - 50     2 WEEKS       50 - 500     3 WEEKS       100 - 10,000     4 WEEKS     1,000 - 30,000     5 WEEKS     3,500 - 115,000   6-8 WEEKS     12,000 - 270,000    12 WEEKS     15,000 - 220,000        FEMALE AND NON-PREGNANT FEMALE:     LESS THAN 5 mIU/mL     EKG: No orders found for this or any previous visit.  Imaging Studies:  Imaging Results    No results found.      Assessment: 1. Third degree uterine prolapse   2. Cystocele, grade 2   Plan: Pt will require a TVH and anterior colporrhaphy, native tissue, vaginal vault suspension and pt elects to have a removal of both tubes and ovaries  Scheduled for 12/23/2015  EURE,LUTHER H 12/07/2015 2:32 PM

## 2015-12-24 ENCOUNTER — Encounter (HOSPITAL_COMMUNITY): Payer: Self-pay | Admitting: Obstetrics & Gynecology

## 2015-12-24 LAB — BASIC METABOLIC PANEL
ANION GAP: 3 — AB (ref 5–15)
BUN: 5 mg/dL — ABNORMAL LOW (ref 6–20)
CHLORIDE: 110 mmol/L (ref 101–111)
CO2: 25 mmol/L (ref 22–32)
Calcium: 8.4 mg/dL — ABNORMAL LOW (ref 8.9–10.3)
Creatinine, Ser: 0.6 mg/dL (ref 0.44–1.00)
GFR calc Af Amer: >60 mL/min (ref >60)
GLUCOSE: 126 mg/dL — AB (ref 65–99)
POTASSIUM: 3.7 mmol/L (ref 3.5–5.1)
Sodium: 138 mmol/L (ref 135–145)

## 2015-12-24 LAB — CBC
HCT: 33.8 % — ABNORMAL LOW (ref 36.0–46.0)
HEMOGLOBIN: 11.9 g/dL — AB (ref 12.0–15.0)
MCH: 31.9 pg (ref 26.0–34.0)
MCHC: 35.2 g/dL (ref 30.0–36.0)
MCV: 90.6 fL (ref 78.0–100.0)
PLATELETS: 244 10*3/uL (ref 150–400)
RBC: 3.73 MIL/uL — AB (ref 3.87–5.11)
RDW: 12.7 % (ref 11.5–15.5)
WBC: 9.5 10*3/uL (ref 4.0–10.5)

## 2015-12-24 MED ORDER — CIPROFLOXACIN HCL 500 MG PO TABS: 500.0000 mg | ORAL_TABLET | Freq: Two times a day (BID) | ORAL | Status: DC

## 2015-12-24 MED ORDER — KETOROLAC TROMETHAMINE 30 MG/ML IJ SOLN
30.0000 mg | Freq: Once | INTRAMUSCULAR | Status: AC
  Administered 2015-12-24: 30 mg via INTRAVENOUS
  Filled 2015-12-24: qty 1

## 2015-12-24 MED ORDER — HYDROMORPHONE HCL 2 MG PO TABS
2.0000 mg | ORAL_TABLET | Freq: Once | ORAL | Status: AC
  Administered 2015-12-24: 2 mg via ORAL

## 2015-12-24 MED ORDER — ESTRADIOL 0.1 MG/24HR TD PTTW: 1.0000 | MEDICATED_PATCH | TRANSDERMAL | Status: DC

## 2015-12-24 MED ORDER — HYDROMORPHONE HCL 2 MG PO TABS: 2.0000 mg | ORAL_TABLET | ORAL | Status: DC | PRN

## 2015-12-24 MED ORDER — ONDANSETRON HCL 8 MG PO TABS: 8.0000 mg | ORAL_TABLET | Freq: Four times a day (QID) | ORAL | Status: DC | PRN

## 2015-12-24 MED ORDER — KETOROLAC TROMETHAMINE 10 MG PO TABS: 10.0000 mg | ORAL_TABLET | Freq: Three times a day (TID) | ORAL | Status: DC | PRN

## 2015-12-24 NOTE — Anesthesia Postprocedure Evaluation (Signed)
Anesthesia Post Note  Patient: Victoria Ramirez  Procedure(s) Performed: Procedure(s) (LRB): HYSTERECTOMY VAGINAL, REMOVAL OF BOTH TUBES AND OVARIES, VAGINAL APEX SUSPENSION (N/A)  Patient location during evaluation: [facility] Anesthesia Type: General Level of consciousness: awake and alert and oriented Pain management: pain level controlled Vital Signs Assessment: post-procedure vital signs reviewed and stable Respiratory status: spontaneous breathing Cardiovascular status: blood pressure returned to baseline Postop Assessment: no signs of nausea or vomiting Anesthetic complications: no    Last Vitals:  Filed Vitals:   12/23/15 2039 12/24/15 0557  BP: 128/78 108/65  Pulse: 72 66  Temp: 37.1 C 37.1 C  Resp: 20 20    Last Pain:  Filed Vitals:   12/24/15 0940  PainSc: 0-No pain                 Constantine Ruddick

## 2015-12-24 NOTE — Discharge Instructions (Signed)
Vaginal Hysterectomy  vaginal hysterectomy  is a surgical procedure to remove the uterus and cervix, and sometimes the ovaries and fallopian tubes. During a tvh, some of the surgical removal is done through the vagina, Your health care provider will discuss the risks and benefits of the different surgical techniques at your appointment. Generally, recovery time is faster and there are fewer complications after VAGINAL procedures than after open incisional procedures. LET [facility] CARE PROVIDER KNOW ABOUT:   Any allergies you have.  All medicines you are taking, including vitamins, herbs, eye drops, creams, and over-the-counter medicines.  Previous problems you or members of your family have had with the use of anesthetics.  Any blood disorders you have.  Previous surgeries you have had.  Medical conditions you have. RISKS AND COMPLICATIONS Generally, this is a safe procedure. However, as with any procedure, complications can occur. Possible complications include:  Allergies to medicines.  Difficulty breathing.  Bleeding.  Infection.  Damage to other structures near your uterus and cervix. BEFORE THE PROCEDURE  Ask your health care provider about changing or stopping your regular medicines.  Take certain medicines, such as a colon-emptying preparation, as directed.  Do not eat or drink anything for at least 8 hours before your surgery.  Stop smoking if you smoke. Stopping will improve your health after surgery.  Arrange for a ride home after surgery and for help at home during recovery. PROCEDURE   An IV tube will be put into one of your veins in order to give you fluids and medicines.  You will receive medicines to relax you and medicines that make you sleep (general anesthetic).  You may have a flexible tube (catheter) put into your bladder to drain urine.  You may have a tube put through your nose or mouth that goes into your stomach (nasogastric tube). The  nasogastric tube removes digestive fluids and prevents you from feeling nauseated and from vomiting.  Tight-fitting (compression) stockings will be placed on your legs to promote circulation.  Three to four small incisions will be made in your abdomen. An incision also will be made in your vagina. Probes and tools will be inserted into the small incisions. The uterus and cervix are removed (and possibly your ovaries and fallopian tubes) through your vagina as well as through the small incisions that were made in the abdomen.  Your vagina is then sewn back to normal. AFTER THE PROCEDURE  You may have a liquid diet temporarily. You will most likely return to, and tolerate, your usual diet the day after surgery.  You will be passing urine through a catheter. It will be removed the day after surgery.  Your temperature, breathing rate, heart rate, blood pressure, and oxygen level will be monitored regularly.  You will still wear compression stockings on your legs until you are able to move around.  You will use a special device or do breathing exercises to keep your lungs clear.  You will be encouraged to walk as soon as possible.   This information is not intended to replace advice given to you by your health care provider. Make sure you discuss any questions you have with your health care provider.   Document Released: 06/23/2011 Document Revised: 07/25/2014 Document Reviewed: 01/17/2013 Elsevier Interactive Patient Education [employer].

## 2015-12-24 NOTE — Discharge Summary (Signed)
Physician Discharge Summary  Patient ID: Victoria Ramirez MRN: [id number] DOB/AGE: April 04, 1971 45 y.o.  Admit date: 12/23/2015 Discharge date: 12/24/2015  Admission Diagnoses: Grade 3 uterine prolpase  Discharge Diagnoses:  Active Problems:   S/P vaginal hysterectomy BSO, vaginal vault suspension  Discharged Condition: good  Hospital Course: unremarkable post op course  Consults: None  Significant Diagnostic Studies: labs:   Treatments: surgery: TVHBSO  Discharge Exam: Blood pressure 108/65, pulse 66, temperature 98.8 F (37.1 C), temperature source Oral, resp. rate 20, height 5\' 5"  (1.651 m), weight 178 lb (80.74 kg), last menstrual period 11/27/2015, SpO2 99 %. General appearance: alert, cooperative and no distress GI: soft, non-tender; bowel sounds normal; no masses,  no organomegaly  Disposition: Final discharge disposition not confirmed  Discharge Instructions    Call MD for:  persistant nausea and vomiting    Complete by:  As directed      Call MD for:  severe uncontrolled pain    Complete by:  As directed      Call MD for:  temperature >100.4    Complete by:  As directed      Diet - low sodium heart healthy    Complete by:  As directed      Driving Restrictions    Complete by:  As directed   No driving for 1 week     Increase activity slowly    Complete by:  As directed      Lifting restrictions    Complete by:  As directed   No lifting     No wound care    Complete by:  As directed      Sexual Activity Restrictions    Complete by:  As directed   I know you're kidding!            Medication List    STOP taking these medications        ibuprofen 200 MG tablet  Commonly known as:  ADVIL,MOTRIN      TAKE these medications        ciprofloxacin 500 MG tablet  Commonly known as:  CIPRO  Take 1 tablet (500 mg total) by mouth 2 (two) times daily.     estradiol 0.1 MG/24HR patch  Commonly known as:  VIVELLE-DOT  Place 1 patch (0.1 mg total) onto the  skin 2 (two) times a week.     HYDROmorphone 2 MG tablet  Commonly known as:  DILAUDID  Take 1-2 tablets (2-4 mg total) by mouth every 4 (four) hours as needed for moderate pain or severe pain.     ketorolac 10 MG tablet  Commonly known as:  TORADOL  Take 1 tablet (10 mg total) by mouth every 8 (eight) hours as needed.     ondansetron 8 MG tablet  Commonly known as:  ZOFRAN  Take 1 tablet (8 mg total) by mouth every 6 (six) hours as needed for nausea.           Follow-up Information    Follow up with Florian Buff, MD In 1 week.   Specialties:  Obstetrics and Gynecology, Radiology   Why:  post op visit   Contact information:   [place] [address] [phone]       Signed: Florian Buff 12/24/2015, 12:11 PM

## 2015-12-24 NOTE — Progress Notes (Signed)
IV removed. Discharge instructions reviewed with patient and husband. Ready for discharge home.

## 2015-12-24 NOTE — Addendum Note (Signed)
Addendum  created 12/24/15 0953 by Ollen Bowl, CRNA   Modules edited: Clinical Notes   Clinical Notes:  File: [id number]

## 2016-01-04 ENCOUNTER — Encounter: Payer: Self-pay | Admitting: Obstetrics & Gynecology

## 2016-01-04 ENCOUNTER — Ambulatory Visit (INDEPENDENT_AMBULATORY_CARE_PROVIDER_SITE_OTHER): Payer: Self-pay | Admitting: Obstetrics & Gynecology

## 2016-01-04 VITALS — BP 100/70 | HR 84 | Wt 172.0 lb

## 2016-01-04 DIAGNOSIS — Z9889 Other specified postprocedural states: Secondary | ICD-10-CM

## 2016-01-04 NOTE — Progress Notes (Signed)
Patient ID: Victoria Ramirez, female   DOB: Aug 13, 1970, 45 y.o.   MRN: [id number]  HPI: Patient returns for routine postoperative follow-up having undergone TVHBSO vaginal vault suspension on 12/23/2015.  The patient's immediate postoperative recovery has been unremarkable. Since hospital discharge the patient reports pressure and tugging.   Current Outpatient Prescriptions: estradiol (VIVELLE-DOT) 0.1 MG/24HR patch, Place 1 patch (0.1 mg total) onto the skin 2 (two) times a week., Disp: 8 patch, Rfl: 12  No current facility-administered medications for this visit.    Blood pressure 100/70, pulse 84, weight 172 lb (78.019 kg), last menstrual period 12/07/2015.  Physical Exam: Abdomen soft normal post op  Diagnostic Tests:   Pathology: negative  Impression: S/p TVHBSO vaginal vault suspension  Plan: Continue vivelle dot patch, will add vagina estrogen when her vagina heals well  Follow up: 4  weeks  Florian Buff, MD

## 2016-02-01 ENCOUNTER — Ambulatory Visit (INDEPENDENT_AMBULATORY_CARE_PROVIDER_SITE_OTHER): Payer: Self-pay | Admitting: Obstetrics & Gynecology

## 2016-02-01 ENCOUNTER — Encounter: Payer: Self-pay | Admitting: Obstetrics & Gynecology

## 2016-02-01 VITALS — BP 110/80 | HR 78 | Wt 175.0 lb

## 2016-02-01 DIAGNOSIS — Z9889 Other specified postprocedural states: Secondary | ICD-10-CM

## 2016-02-01 NOTE — Progress Notes (Signed)
Patient ID: Victoria Ramirez, female   DOB: [birth year], 45 y.o.   MRN: [id number]  HPI: Patient returns for routine postoperative follow-up having undergone TVHBSO with vaginal vault supsension  on 12/23/2015.  The patient's immediate postoperative recovery has been unremarkable. Since hospital discharge the patient reports no problems.   Current Outpatient Prescriptions: estradiol (VIVELLE-DOT) 0.1 MG/24HR patch, Place 1 patch (0.1 mg total) onto the skin 2 (two) times a week., Disp: 8 patch, Rfl: 12  No current facility-administered medications for this visit.    Blood pressure 110/80, pulse 78, weight 175 lb (79.379 kg), last menstrual period 12/07/2015.  Physical Exam: Vagina i healing well  Diagnostic Tests: None  Pathology: Benign  Impression: Status post vaginal hysterectomy with bilateral salpingo-oophorectomy and a vaginal vault suspension Normal postoperative course with proper healing of the vaginal apex and good support of the bladder  Plan: Would still recommend holding off on intercourse until August 9 inches 3 weeks Patch is overall doing well Occasional to rare nighttime hot flash otherwise doing well  Follow up: 1  years  Florian Buff, MD

## 2016-12-16 ENCOUNTER — Encounter: Payer: Self-pay | Admitting: Obstetrics & Gynecology

## 2016-12-16 ENCOUNTER — Ambulatory Visit (INDEPENDENT_AMBULATORY_CARE_PROVIDER_SITE_OTHER): Payer: Self-pay | Admitting: Obstetrics & Gynecology

## 2016-12-16 VITALS — BP 100/60 | HR 76 | Ht 65.0 in | Wt 166.0 lb

## 2016-12-16 DIAGNOSIS — Z01419 Encounter for gynecological examination (general) (routine) without abnormal findings: Secondary | ICD-10-CM

## 2016-12-16 MED ORDER — ESTRADIOL 1 MG/GM TD GEL: 1.0000 | Freq: Every day | TRANSDERMAL | 11 refills | Status: DC

## 2016-12-16 NOTE — Progress Notes (Signed)
Subjective:     Victoria Ramirez is a 46 y.o. female here for a routine exam.  Patient's last menstrual period was 12/07/2015. G1P0 Birth Control Method:  Vag hyst Menstrual Calendar(currently): amenorrheic due to vag hyst  Current complaints: none.   Current acute medical issues:  Before hysterectomy,    Recent Gynecologic History Patient's last menstrual period was 12/07/2015. Last Pap: n/a,   Last mammogram: 2014,  Normal, ordered one and patient encouraged to get it done  Past Medical History:  Diagnosis Date  . Fibroid   . History of PCOS     Past Surgical History:  Procedure Laterality Date  . APPENDECTOMY    . DEEP SHOULDER TUMOR EXCISION Right   . TUBAL LIGATION    . VAGINAL HYSTERECTOMY N/A 12/23/2015   Procedure: HYSTERECTOMY VAGINAL, REMOVAL OF BOTH TUBES AND OVARIES, VAGINAL APEX SUSPENSION;  Surgeon: Florian Buff, MD;  Location: [facility];  Service: Gynecology;  Laterality: N/A;  . WISDOM TOOTH EXTRACTION      OB History    Gravida Para Term Preterm AB Living   1         1   SAB TAB Ectopic Multiple Live Births                  Social History   Social History  . Marital status: Married    Spouse name: N/A  . Number of children: N/A  . Years of education: N/A   Social History Main Topics  . Smoking status: Former Smoker    Packs/day: 1.50    Years: 15.00    Quit date: 12/18/2006  . Smokeless tobacco: Never Used  . Alcohol use Yes     Comment: occ.  . Drug use: No  . Sexual activity: Not Asked   Other Topics Concern  . None   Social History Narrative  . None    Family History  Problem Relation Age of Onset  . Asthma Mother   . Heart disease Father   . Polycystic ovary syndrome Sister      Current Outpatient Prescriptions:  .  Estradiol 1 MG/GM GEL, Place 1 packet onto the skin daily., Disp: 30 g, Rfl: 11  Review of Systems  Review of Systems  Constitutional: Negative for fever, chills, weight loss, malaise/fatigue and diaphoresis.   HENT: Negative for hearing loss, ear pain, nosebleeds, congestion, sore throat, neck pain, tinnitus and ear discharge.   Eyes: Negative for blurred vision, double vision, photophobia, pain, discharge and redness.  Respiratory: Negative for cough, hemoptysis, sputum production, shortness of breath, wheezing and stridor.   Cardiovascular: Negative for chest pain, palpitations, orthopnea, claudication, leg swelling and PND.  Gastrointestinal: negative for abdominal pain. Negative for heartburn, nausea, vomiting, diarrhea, constipation, blood in stool and melena.  Genitourinary: Negative for dysuria, urgency, frequency, hematuria and flank pain.  Musculoskeletal: Negative for myalgias, back pain, joint pain and falls.  Skin: Negative for itching and rash.  Neurological: Negative for dizziness, tingling, tremors, sensory change, speech change, focal weakness, seizures, loss of consciousness, weakness and headaches.  Endo/Heme/Allergies: Negative for environmental allergies and polydipsia. Does not bruise/bleed easily.  Psychiatric/Behavioral: Negative for depression, suicidal ideas, hallucinations, memory loss and substance abuse. The patient is not nervous/anxious and does not have insomnia.        Objective:  Blood pressure 100/60, pulse 76, height 5\' 5"  (1.651 m), weight 166 lb (75.3 kg), last menstrual period 12/07/2015.   Physical Exam  Vitals reviewed. Constitutional: She is oriented  to person, place, and time. She appears well-developed and well-nourished.  HENT:  Head: Normocephalic and atraumatic.        Right Ear: External ear normal.  Left Ear: External ear normal.  Nose: Nose normal.  Mouth/Throat: Oropharynx is clear and moist.  Eyes: Conjunctivae and EOM are normal. Pupils are equal, round, and reactive to light. Right eye exhibits no discharge. Left eye exhibits no discharge. No scleral icterus.  Neck: Normal range of motion. Neck supple. No tracheal deviation present. No  thyromegaly present.  Cardiovascular: Normal rate, regular rhythm, normal heart sounds and intact distal pulses.  Exam reveals no gallop and no friction rub.   No murmur heard. Respiratory: Effort normal and breath sounds normal. No respiratory distress. She has no wheezes. She has no rales. She exhibits no tenderness.  GI: Soft. Bowel sounds are normal. She exhibits no distension and no mass. There is no tenderness. There is no rebound and no guarding.  Genitourinary:  Breasts no masses skin changes or nipple changes bilaterally      Vulva is normal without lesions Vagina is pink moist without discharge Cervix absent Uterus is absent Adnexa is negative surgically absent  Musculoskeletal: Normal range of motion. She exhibits no edema and no tenderness.  Neurological: She is alert and oriented to person, place, and time. She has normal reflexes. She displays normal reflexes. No cranial nerve deficit. She exhibits normal muscle tone. Coordination normal.  Skin: Skin is warm and dry. No rash noted. No erythema. No pallor.  Psychiatric: She has a normal mood and affect. Her behavior is normal. Judgment and thought content normal.       Medications Ordered at today's visit: Meds ordered this encounter  Medications  . Estradiol 1 MG/GM GEL    Sig: Place 1 packet onto the skin daily.    Dispense:  30 g    Refill:  11   Medications Discontinued During This Encounter  Medication Reason  . estradiol (VIVELLE-DOT) 0.1 MG/24HR patch Allergic reaction   Other orders placed at today's visit: Orders Placed This Encounter  Procedures  . MM DIGITAL SCREENING BILATERAL      Assessment:    Healthy female exam.    Plan:    Hormone replacement therapy: divigel 1 mg. Mammogram ordered. Follow up in: 2 years.     Return in about 2 years (around 12/17/2018) for yearly, with Dr Elonda Husky.

## 2017-12-19 ENCOUNTER — Encounter: Payer: Self-pay | Admitting: Obstetrics & Gynecology

## 2017-12-19 ENCOUNTER — Ambulatory Visit (INDEPENDENT_AMBULATORY_CARE_PROVIDER_SITE_OTHER): Payer: Self-pay | Admitting: Obstetrics & Gynecology

## 2017-12-19 VITALS — BP 109/64 | HR 66 | Ht 66.0 in | Wt 161.4 lb

## 2017-12-19 DIAGNOSIS — Z1211 Encounter for screening for malignant neoplasm of colon: Secondary | ICD-10-CM

## 2017-12-19 DIAGNOSIS — Z01419 Encounter for gynecological examination (general) (routine) without abnormal findings: Secondary | ICD-10-CM

## 2017-12-19 DIAGNOSIS — Z01411 Encounter for gynecological examination (general) (routine) with abnormal findings: Secondary | ICD-10-CM

## 2017-12-19 DIAGNOSIS — R3989 Other symptoms and signs involving the genitourinary system: Secondary | ICD-10-CM

## 2017-12-19 DIAGNOSIS — Z1212 Encounter for screening for malignant neoplasm of rectum: Secondary | ICD-10-CM

## 2017-12-19 LAB — HEMOCCULT GUIAC POC 1CARD (OFFICE): Fecal Occult Blood, POC: NEGATIVE

## 2017-12-19 MED ORDER — ESTRADIOL 1 MG/GM TD GEL: 1.0000 | Freq: Every day | TRANSDERMAL | 11 refills | Status: DC

## 2017-12-19 NOTE — Progress Notes (Addendum)
Subjective:     Victoria Ramirez is a 47 y.o. female here for a routine exam.  Patient's last menstrual period was 12/07/2015. G1P0 Birth Control Method:  hysterectomy Menstrual Calendar(currently): amenorrehic  Current complaints: bladder pain, unrelated to voiding, no dysuria, no history of recurrent UTI.   Current acute medical issues:  none   Recent Gynecologic History Patient's last menstrual period was 12/07/2015. Last Pap: before her hysterectomy,  normal Last mammogram: several years,    Past Medical History:  Diagnosis Date  . Fibroid   . History of PCOS     Past Surgical History:  Procedure Laterality Date  . APPENDECTOMY    . DEEP SHOULDER TUMOR EXCISION Right   . TUBAL LIGATION    . VAGINAL HYSTERECTOMY N/A 12/23/2015   Procedure: HYSTERECTOMY VAGINAL, REMOVAL OF BOTH TUBES AND OVARIES, VAGINAL APEX SUSPENSION;  Surgeon: Florian Buff, MD;  Location: [facility];  Service: Gynecology;  Laterality: N/A;  . WISDOM TOOTH EXTRACTION      OB History    Gravida  1   Para      Term      Preterm      AB      Living  1     SAB      TAB      Ectopic      Multiple      Live Births              Social History   Socioeconomic History  . Marital status: Married    Spouse name: Not on file  . Number of children: Not on file  . Years of education: Not on file  . Highest education level: Not on file  Occupational History  . Not on file  Social Needs  . Financial resource strain: Not on file  . Food insecurity:    Worry: Not on file    Inability: Not on file  . Transportation needs:    Medical: Not on file    Non-medical: Not on file  Tobacco Use  . Smoking status: Former Smoker    Packs/day: 1.50    Years: 15.00    Pack years: 22.50    Last attempt to quit: 12/18/2006    Years since quitting: 11.0  . Smokeless tobacco: Never Used  Substance and Sexual Activity  . Alcohol use: Yes    Comment: occ.  . Drug use: No  . Sexual activity: Not on  file  Lifestyle  . Physical activity:    Days per week: Not on file    Minutes per session: Not on file  . Stress: Not on file  Relationships  . Social connections:    Talks on phone: Not on file    Gets together: Not on file    Attends religious service: Not on file    Active member of club or organization: Not on file    Attends meetings of clubs or organizations: Not on file    Relationship status: Not on file  Other Topics Concern  . Not on file  Social History Narrative  . Not on file    Family History  Problem Relation Age of Onset  . Asthma Mother   . Heart disease Father   . Polycystic ovary syndrome Sister   . Heart disease Maternal Aunt   . Hypertension Maternal Aunt   . Hypertension Maternal Aunt   . Heart disease Maternal Aunt   . Hypertension Maternal Aunt   .  Heart disease Maternal Aunt   . Hypertension Maternal Uncle   . Heart disease Maternal Uncle   . Hypertension Maternal Uncle   . Heart disease Maternal Uncle      Current Outpatient Medications:  .  Estradiol 1 MG/GM GEL, Place 1 packet onto the skin daily., Disp: 30 g, Rfl: 11  Review of Systems  Review of Systems  Constitutional: Negative for fever, chills, weight loss, malaise/fatigue and diaphoresis.  HENT: Negative for hearing loss, ear pain, nosebleeds, congestion, sore throat, neck pain, tinnitus and ear discharge.   Eyes: Negative for blurred vision, double vision, photophobia, pain, discharge and redness.  Respiratory: Negative for cough, hemoptysis, sputum production, shortness of breath, wheezing and stridor.   Cardiovascular: Negative for chest pain, palpitations, orthopnea, claudication, leg swelling and PND.  Gastrointestinal: negative for abdominal pain. Negative for heartburn, nausea, vomiting, diarrhea, constipation, blood in stool and melena.  Genitourinary: Negative for dysuria, urgency, frequency, hematuria and flank pain.  Musculoskeletal: Negative for myalgias, back pain,  joint pain and falls.  Skin: Negative for itching and rash.  Neurological: Negative for dizziness, tingling, tremors, sensory change, speech change, focal weakness, seizures, loss of consciousness, weakness and headaches.  Endo/Heme/Allergies: Negative for environmental allergies and polydipsia. Does not bruise/bleed easily.  Psychiatric/Behavioral: Negative for depression, suicidal ideas, hallucinations, memory loss and substance abuse. The patient is not nervous/anxious and does not have insomnia.        Objective:  Blood pressure 109/64, pulse 66, height 5\' 6"  (1.676 m), weight 161 lb 6.4 oz (73.2 kg), last menstrual period 12/07/2015.   Physical Exam  Vitals reviewed. Constitutional: She is oriented to person, place, and time. She appears well-developed and well-nourished.  HENT:  Head: Normocephalic and atraumatic.        Right Ear: External ear normal.  Left Ear: External ear normal.  Nose: Nose normal.  Mouth/Throat: Oropharynx is clear and moist.  Eyes: Conjunctivae and EOM are normal. Pupils are equal, round, and reactive to light. Right eye exhibits no discharge. Left eye exhibits no discharge. No scleral icterus.  Neck: Normal range of motion. Neck supple. No tracheal deviation present. No thyromegaly present.  Cardiovascular: Normal rate, regular rhythm, normal heart sounds and intact distal pulses.  Exam reveals no gallop and no friction rub.   No murmur heard. Respiratory: Effort normal and breath sounds normal. No respiratory distress. She has no wheezes. She has no rales. She exhibits no tenderness.  GI: Soft. Bowel sounds are normal. She exhibits no distension and no mass. There is no tenderness. There is no rebound and no guarding.  Genitourinary:  Breasts no masses skin changes or nipple changes bilaterally      Vulva is normal without lesions Vagina is pink moist without discharge, good support no cystocoele Cervix absent Uterus is absnet Adnexa is negative {Rectal     hemoccult negative, normal tone, no masses  Musculoskeletal: Normal range of motion. She exhibits no edema and no tenderness.  Neurological: She is alert and oriented to person, place, and time. She has normal reflexes. She displays normal reflexes. No cranial nerve deficit. She exhibits normal muscle tone. Coordination normal.  Skin: Skin is warm and dry. No rash noted. No erythema. No pallor.  Psychiatric: She has a normal mood and affect. Her behavior is normal. Judgment and thought content normal.       Medications Ordered at today's visit: Meds ordered this encounter  Medications  . Estradiol 1 MG/GM GEL    Sig: Place 1 packet  onto the skin daily.    Dispense:  30 g    Refill:  11    Other orders placed at today's visit: No orders of the defined types were placed in this encounter.     Assessment:    Healthy female exam.    Plan:    mammogram is scheduled Urine culture, cath specimen is done If negative may have interstitial cystitis and will need management      Return if symptoms worsen or fail to improve.

## 2017-12-19 NOTE — Addendum Note (Signed)
Addended by: Linton Rump on: 12/19/2017 11:33 AM   Modules accepted: Orders

## 2017-12-19 NOTE — Addendum Note (Signed)
Addended by: Florian Buff on: 12/19/2017 11:29 AM   Modules accepted: Orders

## 2017-12-20 ENCOUNTER — Ambulatory Visit (HOSPITAL_COMMUNITY)
Admission: RE | Admit: 2017-12-20 | Discharge: 2017-12-20 | Disposition: A | Discharge status: Home / Self Care | Payer: Self-pay | Source: Ambulatory Visit | Attending: Obstetrics & Gynecology | Admitting: Obstetrics & Gynecology

## 2017-12-20 ENCOUNTER — Encounter (HOSPITAL_COMMUNITY): Payer: Self-pay

## 2017-12-20 DIAGNOSIS — Z01419 Encounter for gynecological examination (general) (routine) without abnormal findings: Secondary | ICD-10-CM | POA: Insufficient documentation

## 2017-12-21 LAB — URINE CULTURE

## 2018-12-06 ENCOUNTER — Telehealth: Payer: Self-pay | Admitting: Obstetrics & Gynecology

## 2018-12-06 NOTE — Telephone Encounter (Signed)
We are making paps starting in July pt said she is going to be out of meds and needs to talk to him about changing her med . She does not want to refill meds and then him change in July. Please talk to me about this note before you make the call

## 2018-12-06 NOTE — Telephone Encounter (Signed)
Pt will do tele visit with Dr. Elonda Husky next week.

## 2018-12-13 ENCOUNTER — Other Ambulatory Visit: Payer: Self-pay

## 2018-12-13 ENCOUNTER — Ambulatory Visit (INDEPENDENT_AMBULATORY_CARE_PROVIDER_SITE_OTHER): Payer: Self-pay | Admitting: Obstetrics & Gynecology

## 2018-12-13 DIAGNOSIS — N951 Menopausal and female climacteric states: Secondary | ICD-10-CM

## 2018-12-13 MED ORDER — ESTRADIOL 2 MG PO TABS: 2.0000 mg | ORAL_TABLET | Freq: Every day | ORAL | 11 refills | Status: DC

## 2018-12-13 NOTE — Progress Notes (Signed)
   TELEHEALTH VIRTUAL GYNECOLOGY VISIT ENCOUNTER NOTE  I connected with Victoria Ramirez on 12/13/18 at  3:00 PM EDT by telephone at home and verified that I am speaking with the correct person using two identifiers.   I discussed the limitations, risks, security and privacy concerns of performing an evaluation and management service by telephone and the availability of in person appointments. I also discussed with the patient that there may be a patient responsible charge related to this service. The patient expressed understanding and agreed to proceed.   History:  Victoria Ramirez is a 48 y.o. G1P0 female being evaluated today for divigel being too expensive and continuance of vasomotor symtpoms. She denies any abnormal vaginal discharge, bleeding, pelvic pain or other concerns.    also worsening of her psoriasis    Past Medical History:  Diagnosis Date  . Fibroid   . History of PCOS    Past Surgical History:  Procedure Laterality Date  . APPENDECTOMY    . DEEP SHOULDER TUMOR EXCISION Right   . TUBAL LIGATION    . VAGINAL HYSTERECTOMY N/A 12/23/2015   Procedure: HYSTERECTOMY VAGINAL, REMOVAL OF BOTH TUBES AND OVARIES, VAGINAL APEX SUSPENSION;  Surgeon: Florian Buff, MD;  Location: [facility];  Service: Gynecology;  Laterality: N/A;  . WISDOM TOOTH EXTRACTION     The following portions of the patient's history were reviewed and updated as appropriate: allergies, current medications, past family history, past medical history, past social history, past surgical history and problem list.   Health Maintenance:  Normal pap and negative HRHPV on .  Normal mammogram on .   Review of Systems:  Pertinent items noted in HPI and remainder of comprehensive ROS otherwise negative.  Physical Exam:  Physical exam deferred due to nature of the encounter  Labs and Imaging No results found for this or any previous visit (from the past 336 hour(s)). No results found.     Meds ordered this encounter   Medications  . estradiol (ESTRACE) 2 MG tablet    Sig: Take 1 tablet (2 mg total) by mouth daily.    Dispense:  30 tablet    Refill:  11    No orders of the defined types were placed in this encounter.   Assessment and Plan:     1. Vasomotor symptoms due to menopause - topical estrogen now too expensive will switch to oral estradiol 2 mg daily and pt will see how her vasomotor symtpoms respond            I discussed the assessment and treatment plan with the patient. The patient was provided an opportunity to ask questions and all were answered. The patient agreed with the plan and demonstrated an understanding of the instructions.   The patient was advised to call back or seek an in-person evaluation/go to the ED if the symptoms worsen or if the condition fails to improve as anticipated.  I provided 11 minutes of non-face-to-face time during this encounter.   Florian Buff, [place] for [facility]

## 2018-12-20 ENCOUNTER — Other Ambulatory Visit: Payer: Self-pay | Admitting: Obstetrics & Gynecology

## 2019-01-29 ENCOUNTER — Other Ambulatory Visit (HOSPITAL_COMMUNITY): Payer: Self-pay | Admitting: Obstetrics & Gynecology

## 2019-01-29 ENCOUNTER — Other Ambulatory Visit: Payer: Self-pay

## 2019-01-29 ENCOUNTER — Ambulatory Visit (INDEPENDENT_AMBULATORY_CARE_PROVIDER_SITE_OTHER): Payer: Self-pay | Admitting: Obstetrics & Gynecology

## 2019-01-29 ENCOUNTER — Encounter: Payer: Self-pay | Admitting: Obstetrics & Gynecology

## 2019-01-29 VITALS — BP 124/84 | HR 69 | Ht 66.0 in | Wt 169.0 lb

## 2019-01-29 DIAGNOSIS — Z1231 Encounter for screening mammogram for malignant neoplasm of breast: Secondary | ICD-10-CM

## 2019-01-29 DIAGNOSIS — Z01419 Encounter for gynecological examination (general) (routine) without abnormal findings: Secondary | ICD-10-CM

## 2019-01-29 NOTE — Progress Notes (Signed)
Subjective:     Victoria Ramirez is a 48 y.o. female here for a routine exam.  Patient's last menstrual period was 12/07/2015. G1P0 Birth Control Method:  hysterectomy Menstrual Calendar(currently): amenorrheic  Current complaints: none.   Current acute medical issues:     Recent Gynecologic History Patient's last menstrual period was 12/07/2015. Last Pap: 2017,  normal Last mammogram: 2019,  normal  Past Medical History:  Diagnosis Date  . Fibroid   . History of PCOS     Past Surgical History:  Procedure Laterality Date  . APPENDECTOMY    . DEEP SHOULDER TUMOR EXCISION Right   . TUBAL LIGATION    . VAGINAL HYSTERECTOMY N/A 12/23/2015   Procedure: HYSTERECTOMY VAGINAL, REMOVAL OF BOTH TUBES AND OVARIES, VAGINAL APEX SUSPENSION;  Surgeon: Florian Buff, MD;  Location: [facility];  Service: Gynecology;  Laterality: N/A;  . WISDOM TOOTH EXTRACTION      OB History    Gravida  1   Para      Term      Preterm      AB      Living  1     SAB      TAB      Ectopic      Multiple      Live Births              Social History   Socioeconomic History  . Marital status: Married    Spouse name: Not on file  . Number of children: Not on file  . Years of education: Not on file  . Highest education level: Not on file  Occupational History  . Not on file  Social Needs  . Financial resource strain: Not on file  . Food insecurity    Worry: Not on file    Inability: Not on file  . Transportation needs    Medical: Not on file    Non-medical: Not on file  Tobacco Use  . Smoking status: Former Smoker    Packs/day: 1.50    Years: 15.00    Pack years: 22.50    Quit date: 12/18/2006    Years since quitting: 12.1  . Smokeless tobacco: Never Used  Substance and Sexual Activity  . Alcohol use: Yes    Comment: occ.  . Drug use: No  . Sexual activity: Yes    Birth control/protection: Surgical  Lifestyle  . Physical activity    Days per week: Not on file    Minutes  per session: Not on file  . Stress: Not on file  Relationships  . Social Herbalist on phone: Not on file    Gets together: Not on file    Attends religious service: Not on file    Active member of club or organization: Not on file    Attends meetings of clubs or organizations: Not on file    Relationship status: Not on file  Other Topics Concern  . Not on file  Social History Narrative  . Not on file    Family History  Problem Relation Age of Onset  . Asthma Mother   . Heart disease Father   . Polycystic ovary syndrome Sister   . Heart disease Maternal Aunt   . Hypertension Maternal Aunt   . Breast cancer Maternal Aunt   . Hypertension Maternal Aunt   . Heart disease Maternal Aunt   . Breast cancer Maternal Aunt   . Hypertension Maternal Aunt   .  Heart disease Maternal Aunt   . Hypertension Maternal Uncle   . Heart disease Maternal Uncle   . Hypertension Maternal Uncle   . Heart disease Maternal Uncle      Current Outpatient Medications:  .  estradiol (ESTRACE) 2 MG tablet, Take 1 tablet (2 mg total) by mouth daily., Disp: 30 tablet, Rfl: 11  Review of Systems  Review of Systems  Constitutional: Negative for fever, chills, weight loss, malaise/fatigue and diaphoresis.  HENT: Negative for hearing loss, ear pain, nosebleeds, congestion, sore throat, neck pain, tinnitus and ear discharge.   Eyes: Negative for blurred vision, double vision, photophobia, pain, discharge and redness.  Respiratory: Negative for cough, hemoptysis, sputum production, shortness of breath, wheezing and stridor.   Cardiovascular: Negative for chest pain, palpitations, orthopnea, claudication, leg swelling and PND.  Gastrointestinal: negative for abdominal pain. Negative for heartburn, nausea, vomiting, diarrhea, constipation, blood in stool and melena.  Genitourinary: Negative for dysuria, urgency, frequency, hematuria and flank pain.  Musculoskeletal: Negative for myalgias, back pain,  joint pain and falls.  Skin: Negative for itching and rash.  Neurological: Negative for dizziness, tingling, tremors, sensory change, speech change, focal weakness, seizures, loss of consciousness, weakness and headaches.  Endo/Heme/Allergies: Negative for environmental allergies and polydipsia. Does not bruise/bleed easily.  Psychiatric/Behavioral: Negative for depression, suicidal ideas, hallucinations, memory loss and substance abuse. The patient is not nervous/anxious and does not have insomnia.        Objective:  Blood pressure 124/84, pulse 69, height 5\' 6"  (1.676 m), weight 169 lb (76.7 kg), last menstrual period 12/07/2015.   Physical Exam  Vitals reviewed. Constitutional: She is oriented to person, place, and time. She appears well-developed and well-nourished.  HENT:  Head: Normocephalic and atraumatic.        Right Ear: External ear normal.  Left Ear: External ear normal.  Nose: Nose normal.  Mouth/Throat: Oropharynx is clear and moist.  Eyes: Conjunctivae and EOM are normal. Pupils are equal, round, and reactive to light. Right eye exhibits no discharge. Left eye exhibits no discharge. No scleral icterus.  Neck: Normal range of motion. Neck supple. No tracheal deviation present. No thyromegaly present.  Cardiovascular: Normal rate, regular rhythm, normal heart sounds and intact distal pulses.  Exam reveals no gallop and no friction rub.   No murmur heard. Respiratory: Effort normal and breath sounds normal. No respiratory distress. She has no wheezes. She has no rales. She exhibits no tenderness.  GI: Soft. Bowel sounds are normal. She exhibits no distension and no mass. There is no tenderness. There is no rebound and no guarding.  Genitourinary:  Breasts no masses skin changes or nipple changes bilaterally      Vulva is normal without lesions Vagina is pink moist without discharge Cervix absent Uterus is absent Adnexa is negative   Musculoskeletal: Normal range of  motion. She exhibits no edema and no tenderness.  Neurological: She is alert and oriented to person, place, and time. She has normal reflexes. She displays normal reflexes. No cranial nerve deficit. She exhibits normal muscle tone. Coordination normal.  Skin: Skin is warm and dry. No rash noted. No erythema. No pallor.  Psychiatric: She has a normal mood and affect. Her behavior is normal. Judgment and thought content normal.       Medications Ordered at today's visit: No orders of the defined types were placed in this encounter.   Other orders placed at today's visit: No orders of the defined types were placed in this encounter.  Assessment:    Healthy female exam.    Plan:    Mammogram ordered. Follow up in: 1 year.     No follow-ups on file.

## 2019-02-06 ENCOUNTER — Encounter (HOSPITAL_COMMUNITY): Payer: Self-pay

## 2019-02-06 ENCOUNTER — Other Ambulatory Visit: Payer: Self-pay

## 2019-02-06 ENCOUNTER — Ambulatory Visit (HOSPITAL_COMMUNITY)
Admission: RE | Admit: 2019-02-06 | Discharge: 2019-02-06 | Disposition: A | Discharge status: Home / Self Care | Payer: Self-pay | Source: Ambulatory Visit | Attending: Obstetrics & Gynecology | Admitting: Obstetrics & Gynecology

## 2019-02-06 DIAGNOSIS — Z1231 Encounter for screening mammogram for malignant neoplasm of breast: Secondary | ICD-10-CM | POA: Insufficient documentation

## 2019-12-10 ENCOUNTER — Other Ambulatory Visit: Payer: Self-pay | Admitting: *Deleted

## 2019-12-10 ENCOUNTER — Telehealth: Payer: Self-pay | Admitting: Obstetrics & Gynecology

## 2019-12-10 MED ORDER — ESTRADIOL 2 MG PO TABS: 2.0000 mg | ORAL_TABLET | Freq: Every day | ORAL | 11 refills | Status: DC

## 2019-12-10 NOTE — Telephone Encounter (Signed)
Patient called stating that she needs a refill of her mediation, she needs it by this Thursday is when she runs out. Please contact pt

## 2019-12-10 NOTE — Telephone Encounter (Signed)
Refill request sent to Dr. Eure.  

## 2020-01-30 ENCOUNTER — Encounter: Payer: Self-pay | Admitting: Obstetrics & Gynecology

## 2020-01-30 ENCOUNTER — Ambulatory Visit (INDEPENDENT_AMBULATORY_CARE_PROVIDER_SITE_OTHER): Payer: BLUE CROSS/BLUE SHIELD | Admitting: Obstetrics & Gynecology

## 2020-01-30 VITALS — BP 132/71 | HR 66 | Ht 65.5 in | Wt 178.0 lb

## 2020-01-30 DIAGNOSIS — Z01419 Encounter for gynecological examination (general) (routine) without abnormal findings: Secondary | ICD-10-CM

## 2020-01-30 MED ORDER — ESTRADIOL 2 MG PO TABS: 2.0000 mg | ORAL_TABLET | Freq: Every day | ORAL | 11 refills | Status: AC

## 2020-01-30 NOTE — Progress Notes (Signed)
Subjective:     Victoria Ramirez is a 49 y.o. female here for a routine exam.  Patient's last menstrual period was 12/07/2015. G1P0 Birth Control Method:  hysterectomy Menstrual Calendar(currently): amenorrhea  Current complaints: none.   Current acute medical issues:  none   Recent Gynecologic History Patient's last menstrual period was 12/07/2015. Last Pap: n/a,   Last mammogram: 2020,  normal  Past Medical History:  Diagnosis Date  . Fibroid   . History of PCOS     Past Surgical History:  Procedure Laterality Date  . APPENDECTOMY    . DEEP SHOULDER TUMOR EXCISION Right   . TUBAL LIGATION    . VAGINAL HYSTERECTOMY N/A 12/23/2015   Procedure: HYSTERECTOMY VAGINAL, REMOVAL OF BOTH TUBES AND OVARIES, VAGINAL APEX SUSPENSION;  Surgeon: Florian Buff, MD;  Location: [facility];  Service: Gynecology;  Laterality: N/A;  . WISDOM TOOTH EXTRACTION      OB History    Gravida  1   Para      Term      Preterm      AB      Living  1     SAB      TAB      Ectopic      Multiple      Live Births              Social History   Socioeconomic History  . Marital status: Married    Spouse name: Not on file  . Number of children: Not on file  . Years of education: Not on file  . Highest education level: Not on file  Occupational History  . Not on file  Tobacco Use  . Smoking status: Former Smoker    Packs/day: 1.50    Years: 15.00    Pack years: 22.50    Quit date: 12/18/2006    Years since quitting: 13.1  . Smokeless tobacco: Never Used  Vaping Use  . Vaping Use: Never used  Substance and Sexual Activity  . Alcohol use: Yes    Comment: occ.  . Drug use: No  . Sexual activity: Yes    Birth control/protection: Surgical  Other Topics Concern  . Not on file  Social History Narrative  . Not on file   Social Determinants of Health   Financial Resource Strain: Low Risk   . Difficulty of Paying Living Expenses: Not very hard  Food Insecurity: No Food  Insecurity  . Worried About Charity fundraiser in the Last Year: Never true  . Ran Out of Food in the Last Year: Never true  Transportation Needs: No Transportation Needs  . Lack of Transportation (Medical): No  . Lack of Transportation (Non-Medical): No  Physical Activity: Insufficiently Active  . Days of Exercise per Week: 5 days  . Minutes of Exercise per Session: 20 min  Stress: No Stress Concern Present  . Feeling of Stress : Only a little  Social Connections: Moderately Integrated  . Frequency of Communication with Friends and Family: More than three times a week  . Frequency of Social Gatherings with Friends and Family: More than three times a week  . Attends Religious Services: 1 to 4 times per year  . Active Member of Clubs or Organizations: No  . Attends Archivist Meetings: Never  . Marital Status: Married    Family History  Problem Relation Age of Onset  . Asthma Mother   . Heart disease Father   .  Polycystic ovary syndrome Sister   . Heart disease Maternal Aunt   . Hypertension Maternal Aunt   . Breast cancer Maternal Aunt   . Hypertension Maternal Aunt   . Heart disease Maternal Aunt   . Breast cancer Maternal Aunt   . Hypertension Maternal Aunt   . Heart disease Maternal Aunt   . Hypertension Maternal Uncle   . Heart disease Maternal Uncle   . Hypertension Maternal Uncle   . Heart disease Maternal Uncle      Current Outpatient Medications:  .  estradiol (ESTRACE) 2 MG tablet, Take 1 tablet (2 mg total) by mouth daily., Disp: 30 tablet, Rfl: 11  Review of Systems  Review of Systems  Constitutional: Negative for fever, chills, weight loss, malaise/fatigue and diaphoresis.  HENT: Negative for hearing loss, ear pain, nosebleeds, congestion, sore throat, neck pain, tinnitus and ear discharge.   Eyes: Negative for blurred vision, double vision, photophobia, pain, discharge and redness.  Respiratory: Negative for cough, hemoptysis, sputum  production, shortness of breath, wheezing and stridor.   Cardiovascular: Negative for chest pain, palpitations, orthopnea, claudication, leg swelling and PND.  Gastrointestinal: negative for abdominal pain. Negative for heartburn, nausea, vomiting, diarrhea, constipation, blood in stool and melena.  Genitourinary: Negative for dysuria, urgency, frequency, hematuria and flank pain.  Musculoskeletal: Negative for myalgias, back pain, joint pain and falls.  Skin: Negative for itching and rash.  Neurological: Negative for dizziness, tingling, tremors, sensory change, speech change, focal weakness, seizures, loss of consciousness, weakness and headaches.  Endo/Heme/Allergies: Negative for environmental allergies and polydipsia. Does not bruise/bleed easily.  Psychiatric/Behavioral: Negative for depression, suicidal ideas, hallucinations, memory loss and substance abuse. The patient is not nervous/anxious and does not have insomnia.        Objective:  Blood pressure 132/71, pulse 66, height 5' 5.5" (1.664 m), weight 178 lb (80.7 kg), last menstrual period 12/07/2015.   Physical Exam  Vitals reviewed. Constitutional: She is oriented to person, place, and time. She appears well-developed and well-nourished.  HENT:  Head: Normocephalic and atraumatic.        Right Ear: External ear normal.  Left Ear: External ear normal.  Nose: Nose normal.  Mouth/Throat: Oropharynx is clear and moist.  Eyes: Conjunctivae and EOM are normal. Pupils are equal, round, and reactive to light. Right eye exhibits no discharge. Left eye exhibits no discharge. No scleral icterus.  Neck: Normal range of motion. Neck supple. No tracheal deviation present. No thyromegaly present.  Cardiovascular: Normal rate, regular rhythm, normal heart sounds and intact distal pulses.  Exam reveals no gallop and no friction rub.   No murmur heard. Respiratory: Effort normal and breath sounds normal. No respiratory distress. She has no  wheezes. She has no rales. She exhibits no tenderness.  GI: Soft. Bowel sounds are normal. She exhibits no distension and no mass. There is no tenderness. There is no rebound and no guarding.  Genitourinary:  Breasts no masses skin changes or nipple changes bilaterally      Vulva is normal without lesions Vagina is pink moist without discharge Cervix absent Uterus is absnet Adnexa is negative   Musculoskeletal: Normal range of motion. She exhibits no edema and no tenderness.  Neurological: She is alert and oriented to person, place, and time. She has normal reflexes. She displays normal reflexes. No cranial nerve deficit. She exhibits normal muscle tone. Coordination normal.  Skin: Skin is warm and dry. No rash noted. No erythema. No pallor.  Psychiatric: She has a normal mood  and affect. Her behavior is normal. Judgment and thought content normal.       Medications Ordered at today's visit: Meds ordered this encounter  Medications  . estradiol (ESTRACE) 2 MG tablet    Sig: Take 1 tablet (2 mg total) by mouth daily.    Dispense:  30 tablet    Refill:  11    Other orders placed at today's visit: No orders of the defined types were placed in this encounter.     Assessment:    Normal Gyn exam.   s/p vag hyst Plan:    Contraception: status post hysterectomy. Mammogram ordered. Follow up in: 1 year. continue estrace 2 mg daily     Return in about 1 year (around 01/29/2021) for yearly, with Dr Elonda Husky.

## 2020-12-19 IMAGING — MG DIGITAL SCREENING BILATERAL MAMMOGRAM WITH TOMO AND CAD
8 series · 8 of 24 positions shown · non-contrast
Comparison: Previous exam(s).

CLINICAL DATA: Screening.

EXAM:
DIGITAL SCREENING BILATERAL MAMMOGRAM WITH TOMO AND CAD

[L MLO synth-2D]
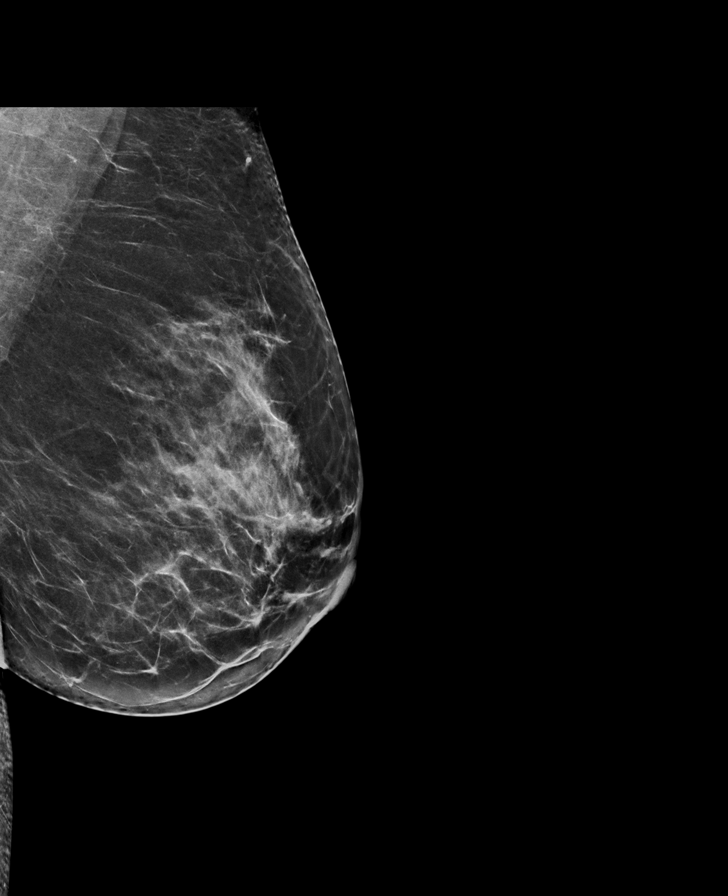

[L CC synth-2D]
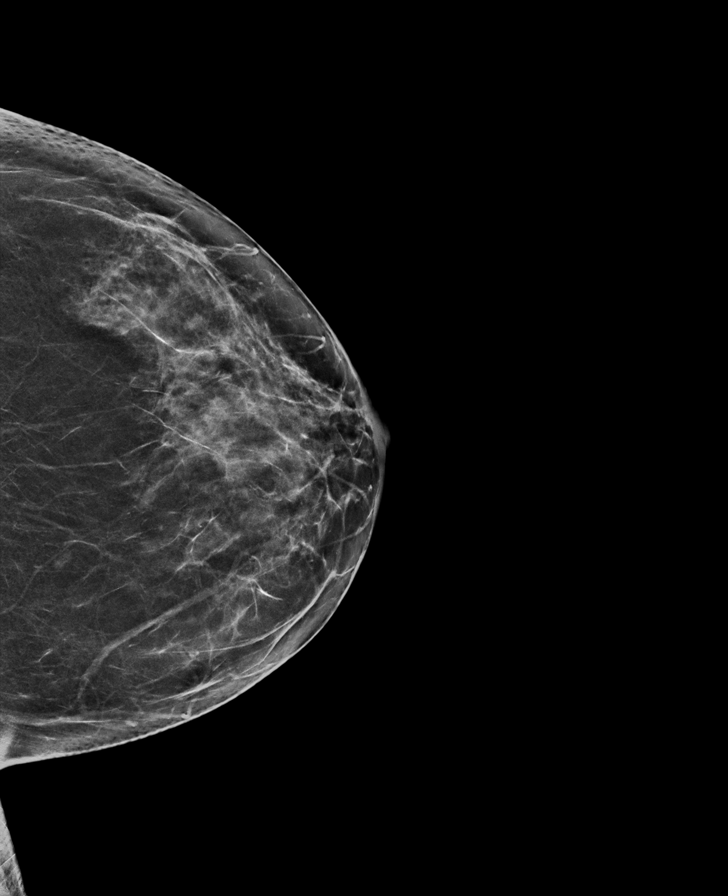

[R CC synth-2D]
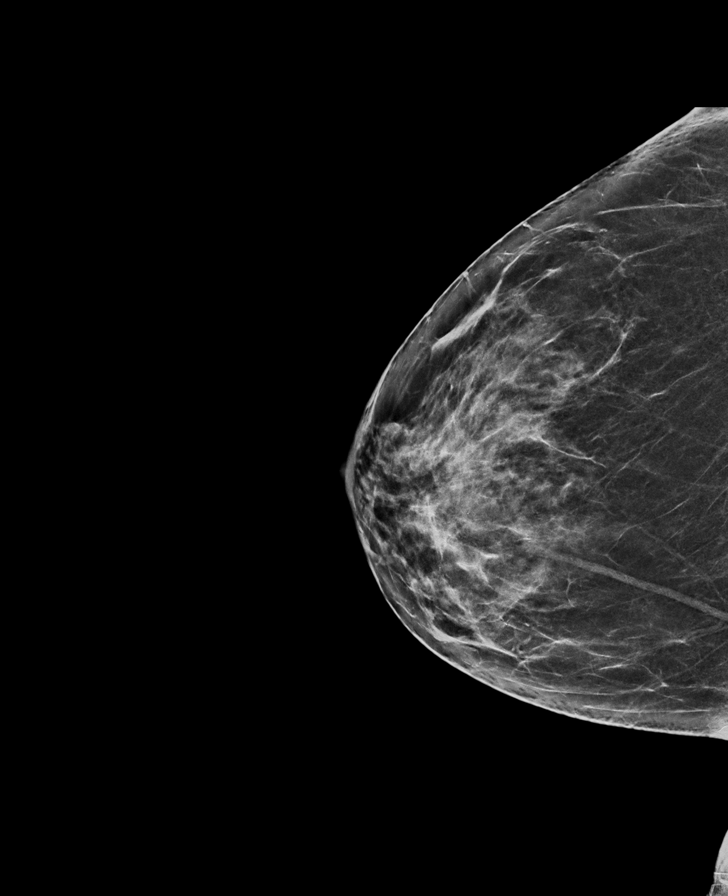

[R MLO synth-2D]
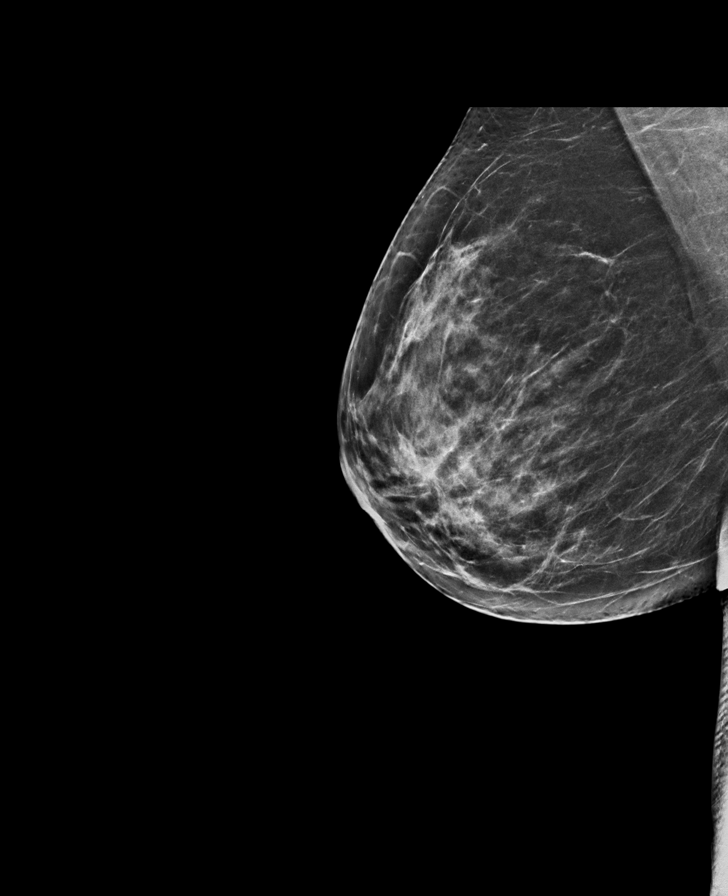

[R CC tomo · tomo slice 33/65.0]
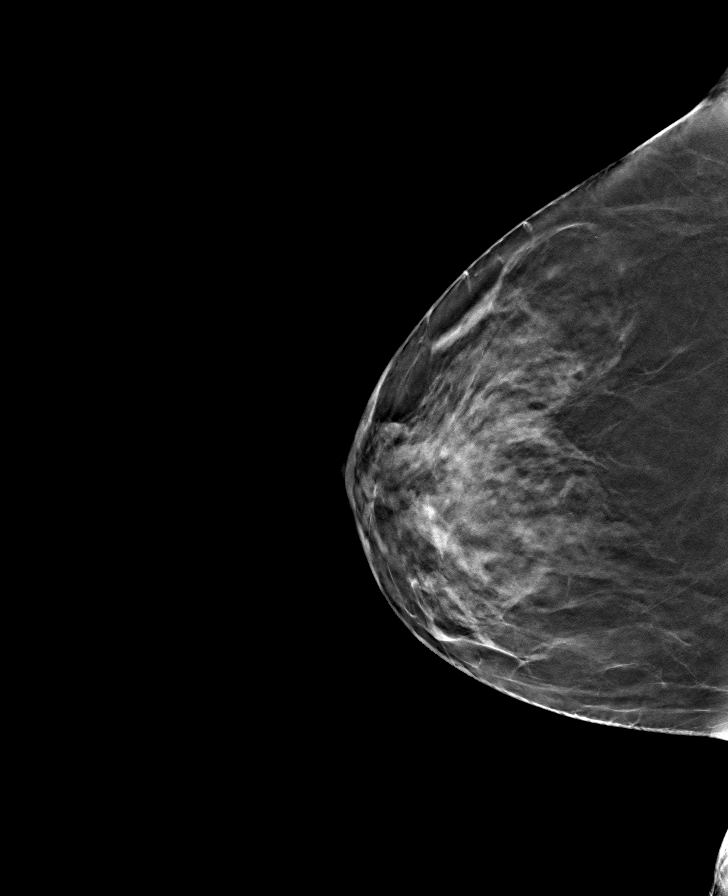

[R MLO tomo · tomo slice 35/68.0]
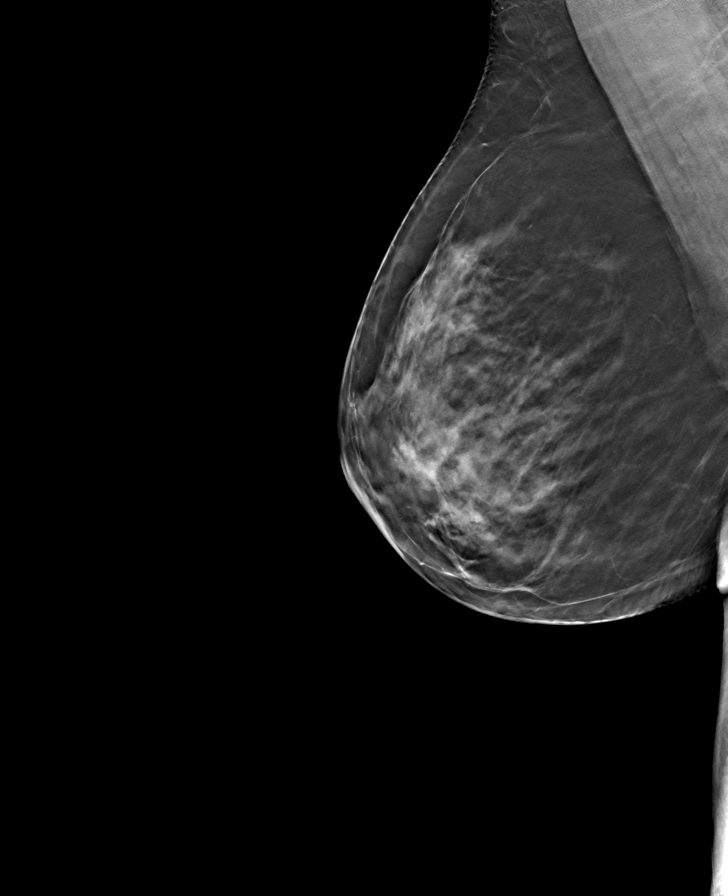

[L MLO tomo · tomo slice 39/77.0]
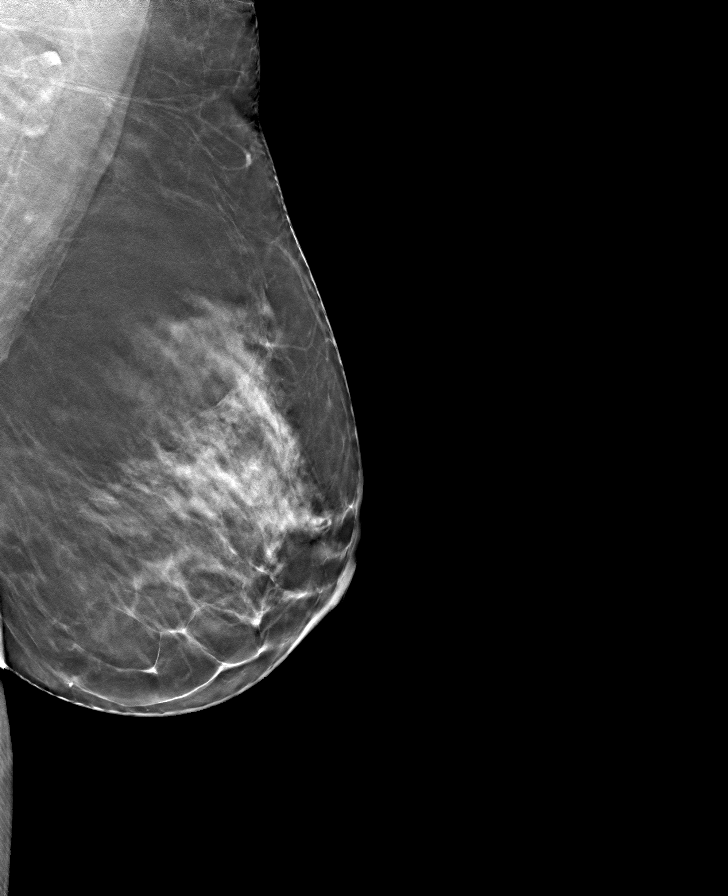

[L CC tomo · tomo slice 33/66.0]
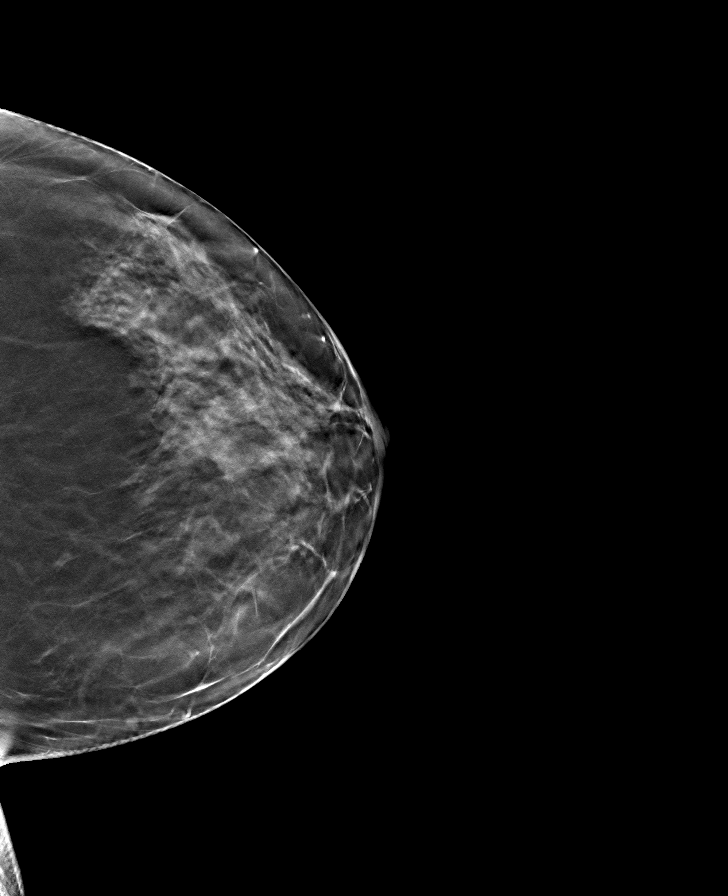

[8 of 24 positions shown; findings below may reference images not displayed]

ACR Breast Density Category c: The breast tissue is heterogeneously
dense, which may obscure small masses.
FINDINGS: There are no findings suspicious for malignancy. Images were
processed with CAD.
IMPRESSION: No mammographic evidence of malignancy. A result letter of this
screening mammogram will be mailed directly to the patient.

RECOMMENDATION:
Screening mammogram in one year. (Code:FT-U-LHB)

BI-RADS CATEGORY  1: Negative.
